# Patient Record
Sex: Female | Born: 1945 | Race: White | Hispanic: No | Marital: Married
Health system: Southern US, Community
[De-identification: ages and names within clinical notes are randomized; demographics above are authoritative.]

## PROBLEM LIST (undated history)

## (undated) DIAGNOSIS — I10 Essential (primary) hypertension: Secondary | ICD-10-CM

## (undated) DIAGNOSIS — E119 Type 2 diabetes mellitus without complications: Secondary | ICD-10-CM

## (undated) DIAGNOSIS — F419 Anxiety disorder, unspecified: Secondary | ICD-10-CM

## (undated) DIAGNOSIS — F331 Major depressive disorder, recurrent, moderate: Principal | ICD-10-CM

---

## 2016-04-09 NOTE — Progress Notes (Signed)
Formatting of this note is different from the original.  INTRAHEPATIC CHOLANGIOCARCINOMA INITIAL VISIT- SURGERY NOTE     HPI   The patient is a 71 year old female who presented in December with hematuria.  Imaging showed a bladder mass as well as findings of cirrhosis with some indeterminate liver lesions.  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  Recommendation was for 6 doses of weekly BCG.  Subsequent further evaluation included an MRI which showed a 2.2 cm lesion in the right hepatic lobe (segment 5) and small lesion in segment 8 (0.8cm).  Biopsy showed adenocarcinoma.  Clinically the patient is doing very well.  No constitutional symptoms abdominal pains nausea vomiting diarrhea constipation melena or hematochezia.    Diagnosis:    Date of 1st Diagnosis: Jan 2018    By: MRI     Labs:    Platelet Count (k/uL)   Date Value   02/06/2016 248   ----------   No results found for: CA199   No results found for: BILIT    PT INR (no units)   Date Value   03/19/2016 1.1   ----------     Imaging:    Details Date   Modality MRI   Number of lesions 2   Site of Lesion Right lobe    Size of largest lesion 2.2   Vascular Invasion No   Lymph node involvement No   Cirrhosis Yes   Extrahepatic spread No     Previous Therapies:  No     Resectable: Yes     Pathology Report:      Biopsy: Yes 03/19/16   Details: Liver biopsy Foci of typical glandular proliferation consistent with   carcinoma in fragments of hepatic parenchyma with bile duct proliferation   and dense chronic inflammatory infiltrate.     Plan:  - Repeat labs including tumour markers  - EGD and colonoscopy  - Tumour board - MRI showed 2 lesions - needs tumour maping in the tumour board  - for liver resection - open    Sampson Goon, MD        SIGNATURE: Theodore Demark, MD PATIENT NAME: Amy Arellano   DATE: April 09, 2016 MRN: [id number]   TIME: 12:48 PM PAGER/CONTACT #:      STAFF NOTE    Pt seen and evaluated by me today. I  agree with the documentation above. Medical records reviewed, data confirmed.   Note adjusted to reflect my direct input.    Theodore Demark, MD  April 09, 2016      Electronically signed by Theodore Demark at 04/09/2016  3:03 PM EDT

## 2016-07-30 ENCOUNTER — Other Ambulatory Visit (HOSPITAL_COMMUNITY): Payer: Self-pay | Admitting: Internal Medicine

## 2016-07-30 DIAGNOSIS — C221 Intrahepatic bile duct carcinoma: Secondary | ICD-10-CM

## 2016-08-04 ENCOUNTER — Ambulatory Visit (HOSPITAL_COMMUNITY)
Admission: RE | Admit: 2016-08-04 | Discharge: 2016-08-04 | Disposition: A | Discharge status: Home / Self Care | Payer: Medicare Other | Source: Ambulatory Visit | Attending: Internal Medicine | Admitting: Internal Medicine

## 2016-08-04 DIAGNOSIS — C221 Intrahepatic bile duct carcinoma: Secondary | ICD-10-CM

## 2016-08-04 DIAGNOSIS — R188 Other ascites: Secondary | ICD-10-CM | POA: Insufficient documentation

## 2016-08-04 NOTE — Procedures (Signed)
Ultrasound-guided  therapeutic paracentesis performed yielding 1.8  liters of yellow fluid. No immediate complications.

## 2019-01-29 IMAGING — US US PARACENTESIS
1 series · 6 of 6 positions shown · non-contrast
Comparison: none

INDICATION: Patient with history of cholangiocarcinoma, ascites. Request made
for therapeutic paracentesis.

[Series 1: us paracentesis · 0.26mm/px · 6 of 6 slices shown]
[im 1/6]
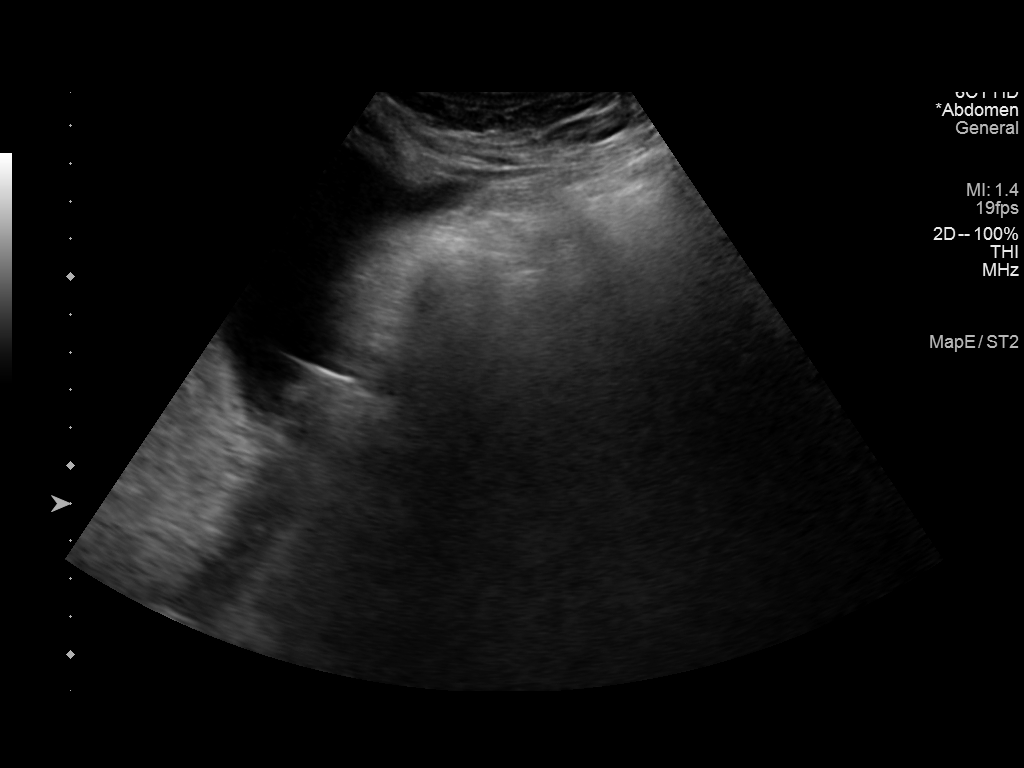
[im 2/6]
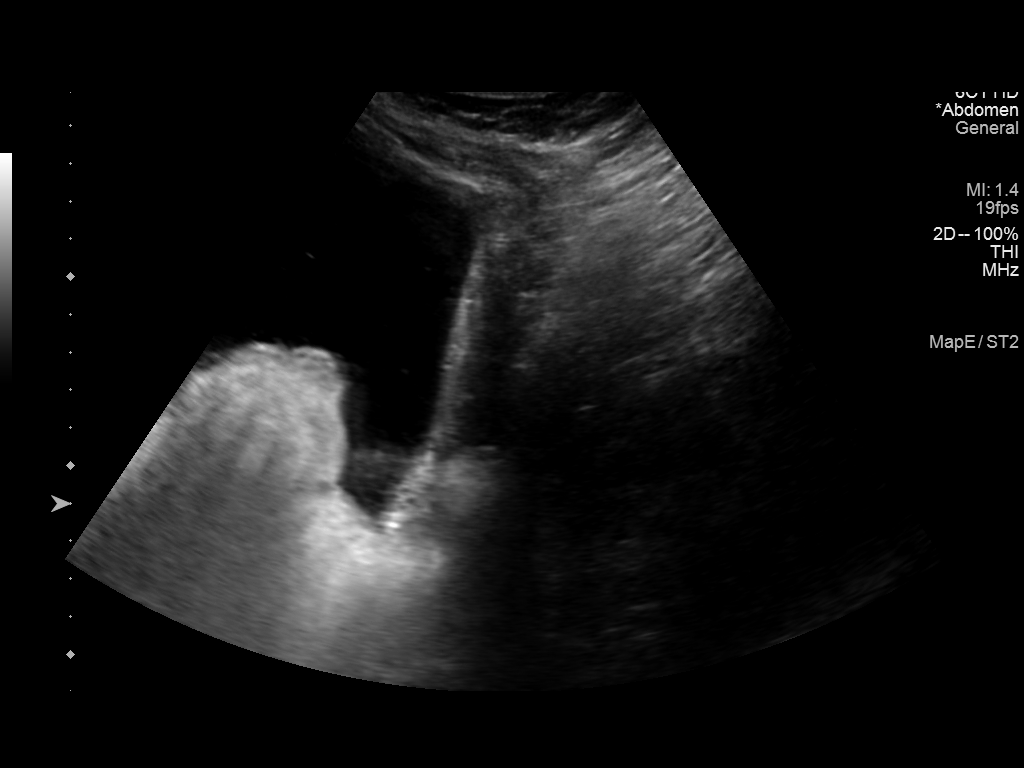
[im 3/6]
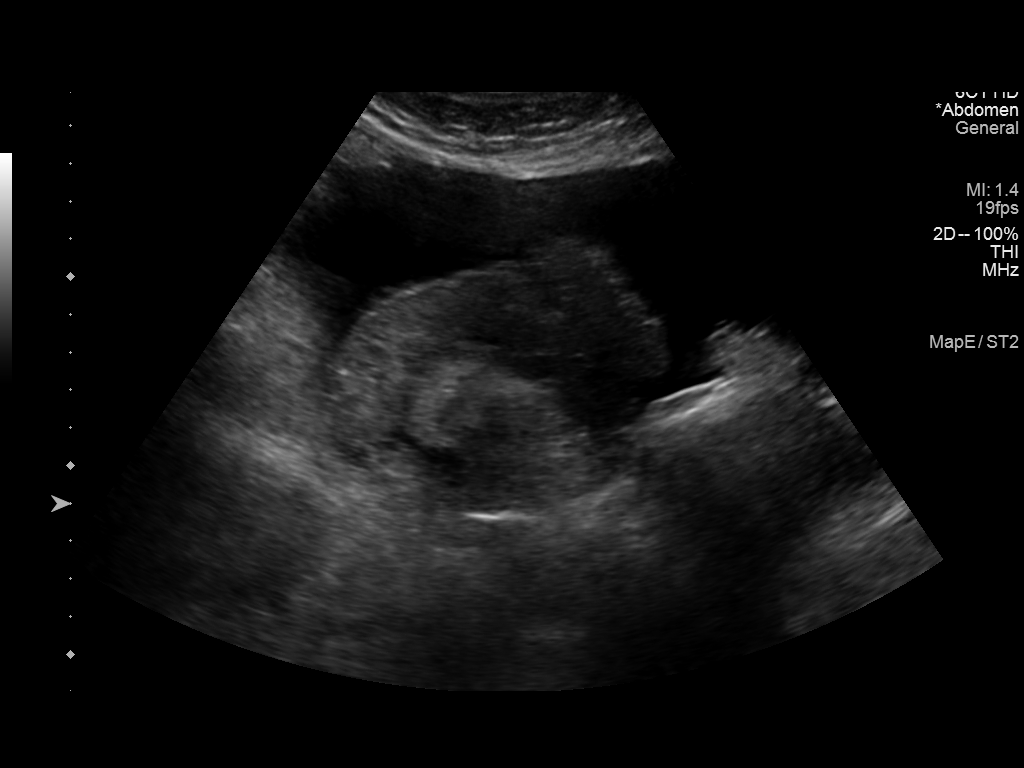
[im 4/6]
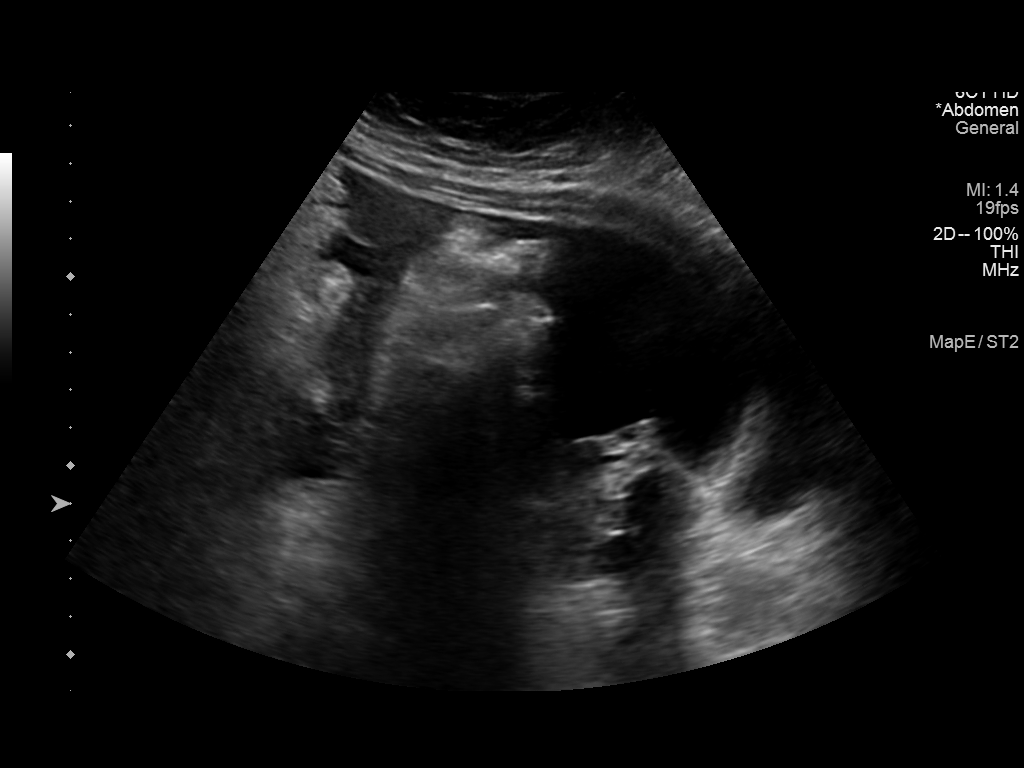
[im 5/6]
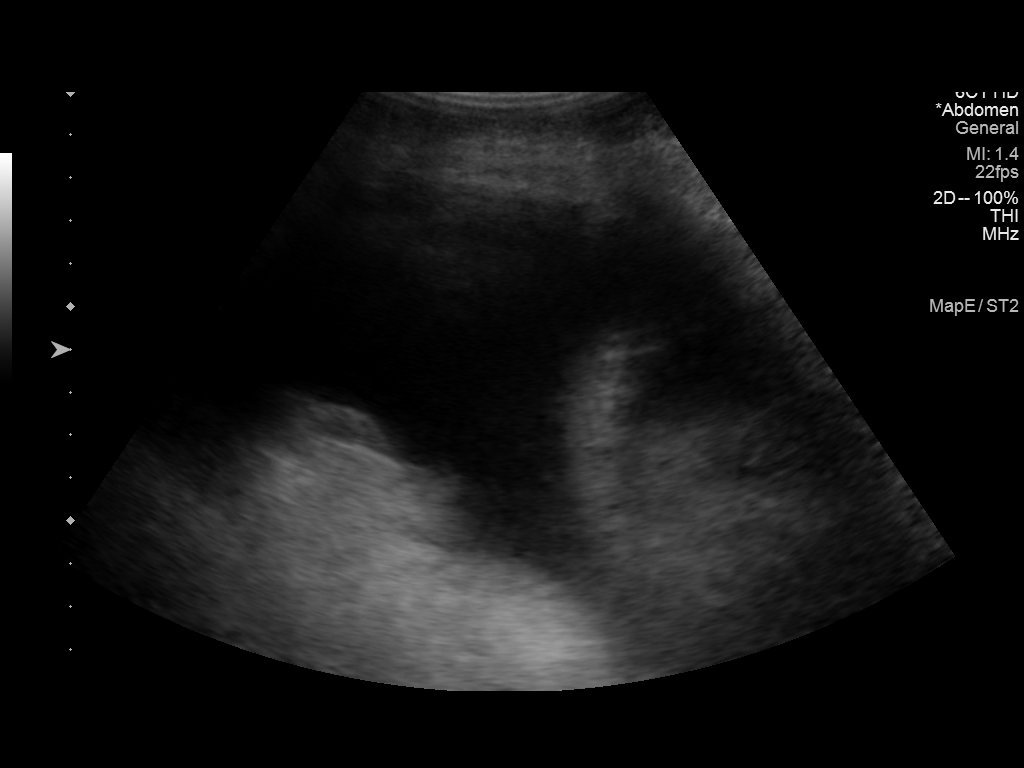
[im 6/6]
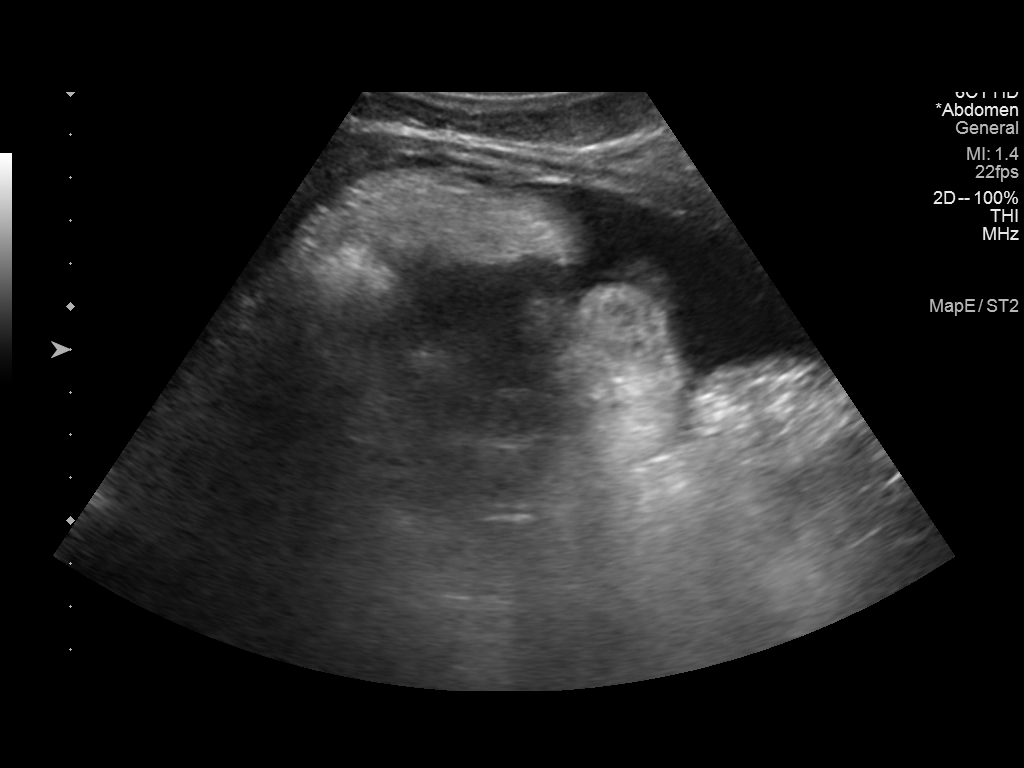

[6 of 6 positions shown; findings below may reference images not displayed]

EXAM:
ULTRASOUND GUIDED THERAPEUTIC PARACENTESIS

MEDICATIONS:
None.

COMPLICATIONS:
None immediate.

PROCEDURE:
Informed written consent was obtained from the patient after a
discussion of the risks, benefits and alternatives to treatment. A
timeout was performed prior to the initiation of the procedure.

Initial ultrasound scanning demonstrates a small to moderate amount
of ascites within the right mid to lower abdominal quadrant. The
right mid to lower abdomen was prepped and draped in the usual
sterile fashion. 1% lidocaine was used for local anesthesia.

Following this, a Yueh catheter was introduced. An ultrasound image
was saved for documentation purposes. The paracentesis was
performed. The catheter was removed and a dressing was applied. The
patient tolerated the procedure well without immediate post
procedural complication.
FINDINGS: A total of approximately 1.8 liters of yellow fluid was removed.
IMPRESSION: Successful ultrasound-guided therapeutic paracentesis yielding
liters of peritoneal fluid.

## 2019-10-21 ENCOUNTER — Inpatient Hospital Stay
Admit: 2019-10-21 | Discharge: 2019-10-21 | Disposition: A | Discharge status: Home / Self Care | Payer: MEDICARE | Attending: Emergency Medicine

## 2019-10-21 ENCOUNTER — Emergency Department: Admit: 2019-10-21 | Payer: MEDICARE | Primary: Internal Medicine

## 2019-10-21 DIAGNOSIS — E871 Hypo-osmolality and hyponatremia: Secondary | ICD-10-CM

## 2019-10-21 LAB — COMPREHENSIVE METABOLIC PANEL
ALT: 32 U/L (ref 5–33)
AST: 39 U/L — ABNORMAL HIGH (ref <32)
Albumin/Globulin Ratio: 1.2 (ref 1.0–2.5)
Albumin: 4.3 g/dL (ref 3.5–5.2)
Alkaline Phosphatase: 104 U/L (ref 35–104)
Anion Gap: 16 mmol/L (ref 9–17)
BUN: 23 mg/dL (ref 8–23)
CO2: 21 mmol/L (ref 20–31)
Calcium: 9.9 mg/dL (ref 8.6–10.4)
Chloride: 89 mmol/L — ABNORMAL LOW (ref 98–107)
Creatinine: 0.82 mg/dL (ref 0.50–0.90)
GFR African American: >60 mL/min (ref >60)
GFR Non-African American: >60 mL/min (ref >60)
Glucose: 157 mg/dL — ABNORMAL HIGH (ref 70–99)
Potassium: 4.3 mmol/L (ref 3.7–5.3)
Sodium: 126 mmol/L — ABNORMAL LOW (ref 135–144)
Total Bilirubin: 0.71 mg/dL (ref 0.3–1.2)
Total Protein: 7.8 g/dL (ref 6.4–8.3)

## 2019-10-21 LAB — BASIC METABOLIC PANEL
Anion Gap: 14 mmol/L (ref 9–17)
BUN: 21 mg/dL (ref 8–23)
CO2: 22 mmol/L (ref 20–31)
Calcium: 9.5 mg/dL (ref 8.6–10.4)
Chloride: 94 mmol/L — ABNORMAL LOW (ref 98–107)
Creatinine: 0.84 mg/dL (ref 0.50–0.90)
GFR African American: >60 mL/min (ref >60)
GFR Non-African American: >60 mL/min (ref >60)
Glucose: 104 mg/dL — ABNORMAL HIGH (ref 70–99)
Potassium: 4 mmol/L (ref 3.7–5.3)
Sodium: 130 mmol/L — ABNORMAL LOW (ref 135–144)

## 2019-10-21 LAB — CBC WITH AUTO DIFFERENTIAL
Absolute Eos #: 0 10*3/uL (ref 0.0–0.4)
Absolute Lymph #: 0.2 10*3/uL — ABNORMAL LOW (ref 1.0–4.8)
Absolute Mono #: 0.29 10*3/uL (ref 0.1–0.8)
Basophils Absolute: 0.1 10*3/uL (ref 0.0–0.2)
Basophils: 1 % (ref 0–2)
Eosinophils %: 0 % — ABNORMAL LOW (ref 1–4)
Hematocrit: 38.4 % (ref 36–46)
Hemoglobin: 12.6 g/dL (ref 12.0–16.0)
Lymphocytes: 2 % — ABNORMAL LOW (ref 24–44)
MCH: 29 pg (ref 26–34)
MCHC: 32.8 g/dL (ref 31–37)
MCV: 88.5 fL (ref 80–100)
MPV: 11.3 fL (ref 8.0–14.0)
Monocytes: 3 % (ref 1–7)
Morphology: NORMAL
Platelets: 179 10*3/uL (ref 140–450)
RBC: 4.34 m/uL (ref 4.0–5.2)
RDW: 15.5 % — ABNORMAL HIGH (ref 12.5–15.4)
Seg Neutrophils: 94 % — ABNORMAL HIGH (ref 36–66)
Segs Absolute: 9.21 10*3/uL — ABNORMAL HIGH (ref 1.8–7.7)
WBC: 9.8 10*3/uL (ref 3.5–11.0)

## 2019-10-21 LAB — URINALYSIS WITH REFLEX TO CULTURE
Bilirubin Urine: NEGATIVE
Glucose, Ur: NEGATIVE
Ketones, Urine: NEGATIVE
Leukocyte Esterase, Urine: NEGATIVE
Nitrite, Urine: NEGATIVE
Protein, UA: NEGATIVE
Specific Gravity, UA: 1.007 (ref 1.005–1.030)
Urine Hgb: NEGATIVE
Urobilinogen, Urine: NORMAL
pH, UA: 5.5 (ref 5.0–8.0)

## 2019-10-21 LAB — LACTIC ACID
Lactic Acid: 2.6 mmol/L — ABNORMAL HIGH (ref 0.5–2.2)
Lactic Acid: 1.9 mmol/L (ref 0.5–2.2)

## 2019-10-21 MED ORDER — POLYETHYLENE GLYCOL 3350 17 G PO PACK
17 g | Freq: Every day | ORAL | 0 refills | Status: AC | PRN
Start: 2019-10-21 — End: 2019-10-28

## 2019-10-21 MED ORDER — SODIUM CHLORIDE 0.9 % IV BOLUS
0.9 % | Freq: Once | INTRAVENOUS | Status: AC
Start: 2019-10-21 — End: 2019-10-21
  Administered 2019-10-21: 21:00:00 1000 mL via INTRAVENOUS

## 2019-10-21 NOTE — ED Notes (Signed)
Delbert Phenix, DO at bedside.     Winifred Olive, RN  10/21/19 1732

## 2019-10-21 NOTE — Discharge Instructions (Signed)
Please make an appointment to follow up with your primary doctor and/or the specialist as we discussed. Take all medications as prescribed.  Return to ER if condition worsens or you develop any new/concerning symptoms as we discussed.

## 2019-10-21 NOTE — ED Notes (Signed)
Patient ambulatory to restroom with steady gait.     Winifred Olive, RN  10/21/19 [phone]

## 2019-10-21 NOTE — ED Provider Notes (Signed)
[facility]  [phone] [address] [address]  [facility] [id number]  Phone: [phone]  Fax: [phone]        [facility]  EMERGENCY DEPARTMENT ENCOUNTER      Pt Name: Amy Arellano  MRN: [id number]  Birthdate 05/15/1945  Date of evaluation: 10/21/2019  Provider: Delbert Pheniximothy M Tore Carreker, DO    CHIEF COMPLAINT       Chief Complaint   Patient presents with   ??? Urinary Frequency         HISTORY OF PRESENT ILLNESS   (Location/Symptom, Timing/Onset,Context/Setting, Quality, Duration, Modifying Factors, Severity)  Note limiting factors.   Amy CorrenteKaren Kue is a 74 y.o. female who presents to the emergency department *For the evaluation of possible UTI.  Patient states that she had a cystoscopy done 8 days ago.  She states that over the weekend following the cystoscopy she started having some cramping/pressure/bloating in her lower abdomen she contacted her primary care physician who put her on Macrobid with a presumed UTI.  She has had what she describes as fever/chills but she did not take her temperature at home.  She is had some nausea and decreased appetite but no vomiting.  Mild dysuria noted.  No diarrhea.  No blood in stool.  No other acute complaints at this time.  All of the symptoms of gotten worse over the past couple of days     Nursing Notes were reviewed.    REVIEW OF SYSTEMS    (2-9systems for level 4, 10 or more for level 5)     Review of Systems   Constitutional: Negative for fever.   HENT: Negative for sore throat.    Respiratory: Negative for shortness of breath.    Cardiovascular: Negative for chest pain.   Gastrointestinal: Positive for abdominal pain. Negative for diarrhea and vomiting.   Genitourinary: Positive for dysuria.   Skin: Negative for rash.   Neurological: Negative for weakness.   All other systems reviewed and are negative.      Except asnoted above the remainder of the review of systems was reviewed and negative.       PAST MEDICAL HISTORY     Past Medical History:   Diagnosis  Date   ??? Cancer (HCC)    ??? Diabetes mellitus (HCC)          SURGICAL HISTORY     History reviewed. No pertinent surgical history.      CURRENT MEDICATIONS     Previous Medications    CHOLECALCIFEROL 50 MCG (2000 UT) TABS    Take 2,000 Units by mouth daily    METFORMIN (GLUCOPHAGE) 1000 MG TABLET    Take 1,000 mg by mouth 2 times daily (with meals)       ALLERGIES     Oxycodone    FAMILY HISTORY       Family History   Family history unknown: Yes          SOCIAL HISTORY       Social History     Socioeconomic History   ??? Marital status: Married     Spouse name: None   ??? Number of children: None   ??? Years of education: None   ??? Highest education level: None   Occupational History   ??? None   Tobacco Use   ??? Smoking status: Never Smoker   ??? Smokeless tobacco: Never Used   Vaping Use   ??? Vaping Use: Never used   Substance and Sexual Activity   ???  Alcohol use: Not Currently   ??? Drug use: Never   ??? Sexual activity: None   Other Topics Concern   ??? None   Social History Narrative   ??? None     Social Determinants of Health     Financial Resource Strain:    ??? Difficulty of Paying Living Expenses:    Food Insecurity:    ??? Worried About Programme researcher, broadcasting/film/video in the Last Year:    ??? Barista in the Last Year:    Transportation Needs:    ??? Freight forwarder (Medical):    ??? Lack of Transportation (Non-Medical):    Physical Activity:    ??? Days of Exercise per Week:    ??? Minutes of Exercise per Session:    Stress:    ??? Feeling of Stress :    Social Connections:    ??? Frequency of Communication with Friends and Family:    ??? Frequency of Social Gatherings with Friends and Family:    ??? Attends Religious Services:    ??? Database administrator or Organizations:    ??? Attends Engineer, structural:    ??? Marital Status:    Intimate Programme researcher, broadcasting/film/video Violence:    ??? Fear of Current or Ex-Partner:    ??? Emotionally Abused:    ??? Physically Abused:    ??? Sexually Abused:        SCREENINGS             PHYSICAL EXAM    (up to 7 for level 4, 8 or  more for level 5)     ED Triage Vitals   BP Temp Temp src Pulse Resp SpO2 Height Weight   -- -- -- -- -- -- -- --       Physical Exam  Vitals and nursing note reviewed.   Constitutional:       General: She is not in acute distress.     Appearance: Normal appearance. She is not ill-appearing or toxic-appearing.   HENT:      Head: Normocephalic and atraumatic.      Nose: Nose normal. No congestion.      Mouth/Throat:      Mouth: Mucous membranes are moist.   Eyes:      General:         Right eye: No discharge.         Left eye: No discharge.      Conjunctiva/sclera: Conjunctivae normal.   Cardiovascular:      Rate and Rhythm: Regular rhythm. Tachycardia present.      Pulses: Normal pulses.      Heart sounds: Normal heart sounds. No murmur heard.     Pulmonary:      Effort: Pulmonary effort is normal. No respiratory distress.      Breath sounds: Normal breath sounds. No wheezing.   Abdominal:      General: Abdomen is flat. There is no distension.      Palpations: Abdomen is soft.      Tenderness: There is abdominal tenderness. There is no guarding or rebound.      Comments: Mild suprapubic tenderness noted   Musculoskeletal:         General: No deformity or signs of injury. Normal range of motion.      Cervical back: Normal range of motion.   Skin:     General: Skin is warm and dry.      Capillary Refill: Capillary refill takes less  than 2 seconds.      Findings: No rash.   Neurological:      General: No focal deficit present.      Mental Status: She is alert. Mental status is at baseline.      Motor: No weakness.      Comments: Speaking normally. No facial asymmetry. Moving all 4 extremities. Normal gait.    Psychiatric:         Mood and Affect: Mood normal.         EMERGENCY DEPARTMENT COURSE and DIFFERENTIAL DIAGNOSIS/MDM:   Vitals:    Vitals:    10/21/19 1555   BP: (!) 142/82   Pulse: 105   Resp: 18   Temp: 98.5 ??F (36.9 ??C)   TempSrc: Oral   SpO2: 99%   Weight: 63.5 kg (140 lb)   Height: 5' (1.524 m)       Patient  presents to the emergency department with a complaint described above.  Vital signs are grossly normal, patient nontoxic, physical examination reveals mild suprapubic tenderness.  She is a little bit tachycardic, certainly have some concern that the Macrobid is not covering the possible UTI that she has.  I do think she could have an a sending urinary tract infection, less likely rupture or different complication from her cystoscopy.  I am going to get full metabolic work-up that includes lactic acid I will get a noncontrast CT of the abdomen and pelvis I am going to give some IV fluids and I will reevaluate      DIAGNOSTIC RESULTS     LABS:  Labs Reviewed   CBC WITH AUTO DIFFERENTIAL - Abnormal; Notable for the following components:       Result Value    RDW 15.5 (*)     Seg Neutrophils 94 (*)     Lymphocytes 2 (*)     Eosinophils % 0 (*)     Segs Absolute 9.21 (*)     Absolute Lymph # 0.20 (*)     All other components within normal limits   COMPREHENSIVE METABOLIC PANEL - Abnormal; Notable for the following components:    Glucose 157 (*)     Sodium 126 (*)     Chloride 89 (*)     AST 39 (*)     All other components within normal limits   LACTIC ACID - Abnormal; Notable for the following components:    Lactic Acid 2.6 (*)     All other components within normal limits   BASIC METABOLIC PANEL - Abnormal; Notable for the following components:    Glucose 104 (*)     Sodium 130 (*)     Chloride 94 (*)     All other components within normal limits   URINE RT REFLEX TO CULTURE   LACTIC ACID       All other labs were within normal range or not returned as of this dictation.    RADIOLOGY:  CT ABDOMEN PELVIS WO CONTRAST Additional Contrast? None   Final Result   Mild retained stool.  No bowel obstruction.      Moderate colonic diverticulosis.      Mild to moderate ascites, nonspecific.               ED Course as of Oct 20 1828   Sat Oct 21, 2019   1705 Hyponatremia noted with sodium 126, patient receiving IV fluids.  No  leukocytosis.  Lactic acid mildly elevated at 2.6.  Urine grossly  unremarkable    [TS]   1730 There is mild retained stool without bowel obstruction but no other significant abnormalities on CT scan    [TS]   1731 Patient sodium was mildly low back in June at 134, will repeat lactic acid and sodium level today prior to discharge    [TS]   1735 On reevaluation patient does explain that she actually has had hyponatremia in the past, she was lower than this previously. We will recheck labs after fluid    [TS]   1827 Take acid down to 1.9 and sodium back up to 130.  Patient instructed to follow-up with primary care physician Monday for reevaluation and further treatment.  We will discharge her on some MiraLAX as this could be the cause of her abdominal discomfort.  Have recommended return to ED if condition worsens or she has any new or concerning symptoms in general    At this time the patient is without objective evidence of an acute process requiring hospitalization or inpatient management. They have remained hemodynamically stable and are stable for discharge with outpatient follow-up. Standard anticipatory guidance given to patient upon discharge.  Have given them a specific time frame in which to follow-up and who to follow-up with.  I have also advised them that they should return to the emergency department if they get worse, or not getting better or develop any new or concerning symptoms.  Patient demonstrates understanding.    [TS]      ED Course User Index  [TS] Delbert Phenix, DO         PROCEDURES:  Unless otherwise noted below, none     Procedures    FINAL IMPRESSION      1. Hyponatremia    2. Constipation, unspecified constipation type          DISPOSITION/PLAN   DISPOSITION        PATIENT REFERRED TO:  Adalberto Cole, MD  [address]  [place] [address]  [phone]    In 3 days        DISCHARGE MEDICATIONS:  New Prescriptions    POLYETHYLENE GLYCOL (MIRALAX) 17 G PACKET    Take 17 g by mouth daily  as needed for Constipation          (Please note that portions of this note were completed with a voice recognition program.  Efforts were made to edit the dictations but occasionally words are mis-transcribed.)    Delbert Phenix, DO (electronically signed)  Board Certified Emergency Physician          Delbert Phenix, DO  10/21/19 1830

## 2019-10-23 ENCOUNTER — Emergency Department: Admit: 2019-10-23 | Payer: MEDICARE | Primary: Internal Medicine

## 2019-10-23 ENCOUNTER — Inpatient Hospital Stay
Admission: EM | Admit: 2019-10-23 | Discharge: 2019-10-25 | Disposition: A | Discharge status: Home / Self Care | Payer: Medicare Other | Admitting: Internal Medicine

## 2019-10-23 LAB — COMPREHENSIVE METABOLIC PANEL
ALT: 48 U/L — ABNORMAL HIGH (ref 5–33)
AST: 60 U/L — ABNORMAL HIGH (ref <32)
Albumin: 4 g/dL (ref 3.5–5.2)
Alkaline Phosphatase: 171 U/L — ABNORMAL HIGH (ref 35–104)
Anion Gap: 21 mmol/L — ABNORMAL HIGH (ref 9–17)
BUN: 22 mg/dL (ref 8–23)
CO2: 18 mmol/L — ABNORMAL LOW (ref 20–31)
Calcium: 10.7 mg/dL — ABNORMAL HIGH (ref 8.6–10.4)
Chloride: 96 mmol/L — ABNORMAL LOW (ref 98–107)
Creatinine: 1.41 mg/dL — ABNORMAL HIGH (ref 0.50–0.90)
GFR African American: 44 mL/min — ABNORMAL LOW (ref >60)
GFR Non-African American: 37 mL/min — ABNORMAL LOW (ref >60)
Glucose: 113 mg/dL — ABNORMAL HIGH (ref 70–99)
Potassium: 3.4 mmol/L — ABNORMAL LOW (ref 3.7–5.3)
Sodium: 135 mmol/L (ref 135–144)
Total Bilirubin: 1.06 mg/dL (ref 0.3–1.2)
Total Protein: 7.6 g/dL (ref 6.4–8.3)

## 2019-10-23 LAB — TROPONIN
Troponin, High Sensitivity: 30 ng/L — ABNORMAL HIGH (ref 0–14)
Troponin, High Sensitivity: 25 ng/L — ABNORMAL HIGH (ref 0–14)

## 2019-10-23 LAB — CBC WITH AUTO DIFFERENTIAL
Absolute Bands #: 2.71 10*3/uL — ABNORMAL HIGH (ref 0.0–1.0)
Absolute Eos #: 0 10*3/uL (ref 0.0–0.4)
Absolute Lymph #: 0.16 10*3/uL — ABNORMAL LOW (ref 1.0–4.8)
Absolute Mono #: 0.25 10*3/uL (ref 0.1–1.3)
Bands: 33 % — ABNORMAL HIGH (ref 0–10)
Basophils Absolute: 0 10*3/uL (ref 0.0–0.2)
Basophils: 0 % (ref 0–2)
Eosinophils %: 0 % (ref 0–4)
Hematocrit: 43.5 % (ref 36–46)
Hemoglobin: 14.2 g/dL (ref 12.0–16.0)
Lymphocytes: 2 % — ABNORMAL LOW (ref 24–44)
MCH: 28.5 pg (ref 26–34)
MCHC: 32.7 g/dL (ref 31–37)
MCV: 87.4 fL (ref 80–100)
MPV: 7.8 fL (ref 6.0–12.0)
Metamyelocytes Absolute: 0.25 10*3/uL — ABNORMAL HIGH
Metamyelocytes: 3 % — ABNORMAL HIGH
Monocytes: 3 % (ref 1–7)
Platelets: 184 10*3/uL (ref 150–450)
RBC: 4.98 m/uL (ref 4.0–5.2)
RDW: 16.9 % — ABNORMAL HIGH (ref 11.5–14.9)
Seg Neutrophils: 59 % (ref 36–66)
Segs Absolute: 4.83 10*3/uL (ref 1.3–9.1)
WBC: 8.2 10*3/uL (ref 3.5–11.0)

## 2019-10-23 LAB — MICROSCOPIC URINALYSIS
Epithelial Cells UA: 20 /HPF
RBC, UA: 2 /HPF
WBC, UA: 10 /HPF

## 2019-10-23 LAB — URINALYSIS WITH REFLEX TO CULTURE
Glucose, Ur: NEGATIVE
Nitrite, Urine: NEGATIVE
Specific Gravity, UA: 1.014 (ref 1.000–1.030)
Urine Hgb: NEGATIVE
Urobilinogen, Urine: NORMAL
pH, UA: 5 (ref 5.0–8.0)

## 2019-10-23 LAB — MAGNESIUM: Magnesium: 1 mg/dL — ABNORMAL LOW (ref 1.6–2.6)

## 2019-10-23 LAB — CORTISOL TOTAL: Cortisol: 87 ug/dL — ABNORMAL HIGH (ref 2.7–18.4)

## 2019-10-23 LAB — LACTATE, SEPSIS
Lactic Acid, Sepsis: 3.4 mmol/L — ABNORMAL HIGH (ref 0.5–1.9)
Lactic Acid, Sepsis: 2.9 mmol/L — ABNORMAL HIGH (ref 0.5–1.9)

## 2019-10-23 MED ORDER — MIDODRINE HCL 5 MG PO TABS
5 MG | Freq: Once | ORAL | Status: AC
Start: 2019-10-23 — End: 2019-10-23
  Administered 2019-10-23: 18:00:00 5 mg via ORAL

## 2019-10-23 MED ORDER — DEXTROSE 5 % IV SOLN
5 % | Freq: Once | INTRAVENOUS | Status: AC
Start: 2019-10-23 — End: 2019-10-23
  Administered 2019-10-23: 15:00:00 2000 mg via INTRAVENOUS

## 2019-10-23 MED ORDER — SODIUM CHLORIDE 0.9 % IV BOLUS
0.9 % | Freq: Once | INTRAVENOUS | Status: AC
Start: 2019-10-23 — End: 2019-10-23
  Administered 2019-10-23: 13:00:00 1000 mL via INTRAVENOUS

## 2019-10-23 MED ORDER — INSULIN LISPRO 100 UNIT/ML SC SOLN
100 UNIT/ML | Freq: Three times a day (TID) | SUBCUTANEOUS | Status: DC
Start: 2019-10-23 — End: 2019-10-25
  Administered 2019-10-24 – 2019-10-25 (×2): 2 [IU] via SUBCUTANEOUS

## 2019-10-23 MED ORDER — ACETAMINOPHEN 325 MG PO TABS
325 MG | Freq: Four times a day (QID) | ORAL | Status: DC | PRN
Start: 2019-10-23 — End: 2019-10-25

## 2019-10-23 MED ORDER — DULOXETINE HCL 60 MG PO CPEP
60 MG | Freq: Every day | ORAL | Status: DC
Start: 2019-10-23 — End: 2019-10-25
  Administered 2019-10-24 – 2019-10-25 (×2): 60 mg via ORAL

## 2019-10-23 MED ORDER — ENOXAPARIN SODIUM 40 MG/0.4ML SC SOLN
40 | Freq: Every day | SUBCUTANEOUS | Status: DC
Start: 2019-10-23 — End: 2019-10-23

## 2019-10-23 MED ORDER — ENOXAPARIN SODIUM 30 MG/0.3ML SC SOLN
30 MG/0.3ML | Freq: Every day | SUBCUTANEOUS | Status: DC
Start: 2019-10-23 — End: 2019-10-25
  Administered 2019-10-24 – 2019-10-25 (×3): 30 mg via SUBCUTANEOUS

## 2019-10-23 MED ORDER — MIDODRINE HCL 10 MG PO TABS
10 MG | ORAL | Status: DC | PRN
Start: 2019-10-23 — End: 2019-10-25
  Administered 2019-10-24: 06:00:00 10 mg via ORAL

## 2019-10-23 MED ORDER — TETANUS-DIPHTH-ACELL PERTUSSIS 5-2.5-18.5 LF-MCG/0.5 IM SUSP
5-2.5-18.5-0.5 LF-MCG/0.5 | Freq: Once | INTRAMUSCULAR | Status: AC
Start: 2019-10-23 — End: 2019-10-23
  Administered 2019-10-23: 14:00:00 0.5 mL via INTRAMUSCULAR

## 2019-10-23 MED ORDER — POTASSIUM CHLORIDE CRYS ER 20 MEQ PO TBCR
20 | ORAL | Status: DC | PRN
Start: 2019-10-23 — End: 2019-10-23

## 2019-10-23 MED ORDER — ACETAMINOPHEN 650 MG RE SUPP
650 MG | Freq: Four times a day (QID) | RECTAL | Status: DC | PRN
Start: 2019-10-23 — End: 2019-10-25

## 2019-10-23 MED ORDER — ONDANSETRON 4 MG PO TBDP
4 MG | Freq: Three times a day (TID) | ORAL | Status: DC | PRN
Start: 2019-10-23 — End: 2019-10-25

## 2019-10-23 MED ORDER — NORMAL SALINE FLUSH 0.9 % IV SOLN
0.9 | INTRAVENOUS | Status: DC | PRN
Start: 2019-10-23 — End: 2019-10-25

## 2019-10-23 MED ORDER — INSULIN LISPRO 100 UNIT/ML SC SOLN
100 UNIT/ML | Freq: Every evening | SUBCUTANEOUS | Status: DC
Start: 2019-10-23 — End: 2019-10-25
  Administered 2019-10-24: 01:00:00 2 [IU] via SUBCUTANEOUS

## 2019-10-23 MED ORDER — SODIUM CHLORIDE 0.9 % IV BOLUS
0.9 % | Freq: Once | INTRAVENOUS | Status: AC
Start: 2019-10-23 — End: 2019-10-23
  Administered 2019-10-23: 17:00:00 1000 mL via INTRAVENOUS

## 2019-10-23 MED ORDER — CEFTRIAXONE SODIUM 1 G IJ SOLR
1 g | Freq: Once | INTRAMUSCULAR | Status: AC
Start: 2019-10-23 — End: 2019-10-23
  Administered 2019-10-23: 14:00:00 1000 mg via INTRAVENOUS

## 2019-10-23 MED ORDER — POTASSIUM CHLORIDE 10 MEQ/100ML IV SOLN
10 | INTRAVENOUS | Status: DC | PRN
Start: 2019-10-23 — End: 2019-10-23

## 2019-10-23 MED ORDER — ONDANSETRON HCL 4 MG/2ML IJ SOLN
4 MG/2ML | Freq: Four times a day (QID) | INTRAMUSCULAR | Status: DC | PRN
Start: 2019-10-23 — End: 2019-10-25

## 2019-10-23 MED ORDER — MAGNESIUM SULFATE 50 % IJ SOLN
50 % | Freq: Once | INTRAMUSCULAR | Status: AC
Start: 2019-10-23 — End: 2019-10-23
  Administered 2019-10-23: 21:00:00 2000 mg via INTRAVENOUS

## 2019-10-23 MED ORDER — POTASSIUM CHLORIDE CRYS ER 20 MEQ PO TBCR
20 MEQ | Freq: Once | ORAL | Status: AC
Start: 2019-10-23 — End: 2019-10-23
  Administered 2019-10-23: 22:00:00 40 meq via ORAL

## 2019-10-23 MED ORDER — POLYETHYLENE GLYCOL 3350 17 G PO PACK
17 g | Freq: Every day | ORAL | Status: DC | PRN
Start: 2019-10-23 — End: 2019-10-25

## 2019-10-23 MED ORDER — SODIUM CHLORIDE 0.9 % IV SOLN
0.9 | INTRAVENOUS | Status: DC | PRN
Start: 2019-10-23 — End: 2019-10-25

## 2019-10-23 MED ORDER — SPIRONOLACTONE 25 MG PO TABS
25 MG | Freq: Every day | ORAL | Status: DC
Start: 2019-10-23 — End: 2019-10-25
  Administered 2019-10-24 – 2019-10-25 (×2): 25 mg via ORAL

## 2019-10-23 MED ORDER — SODIUM CHLORIDE 0.9 % IV SOLN
0.9 % | INTRAVENOUS | Status: DC
Start: 2019-10-23 — End: 2019-10-25
  Administered 2019-10-23 – 2019-10-25 (×3): via INTRAVENOUS

## 2019-10-23 MED ORDER — FUROSEMIDE 20 MG PO TABS
20 MG | Freq: Every day | ORAL | Status: DC
Start: 2019-10-23 — End: 2019-10-25
  Administered 2019-10-24 – 2019-10-25 (×2): 20 mg via ORAL

## 2019-10-23 MED ORDER — POTASSIUM BICARB-CITRIC ACID 20 MEQ PO TBEF
20 MEQ | ORAL | Status: DC | PRN
Start: 2019-10-23 — End: 2019-10-23

## 2019-10-23 MED ORDER — NORMAL SALINE FLUSH 0.9 % IV SOLN
0.9 | Freq: Two times a day (BID) | INTRAVENOUS | Status: DC
Start: 2019-10-23 — End: 2019-10-25
  Administered 2019-10-24 – 2019-10-25 (×2): 10 mL via INTRAVENOUS

## 2019-10-23 MED FILL — MAGNESIUM SULFATE 50 % IJ SOLN: 50 % | INTRAMUSCULAR | Qty: 4

## 2019-10-23 MED FILL — PEG 3350 17 G PO PACK: 17 g | ORAL | Qty: 1

## 2019-10-23 MED FILL — MIDODRINE HCL 5 MG PO TABS: 5 mg | ORAL | Qty: 1

## 2019-10-23 MED FILL — POTASSIUM CHLORIDE CRYS ER 20 MEQ PO TBCR: 20 meq | ORAL | Qty: 2

## 2019-10-23 MED FILL — CEFTRIAXONE SODIUM 1 G IJ SOLR: 1 g | INTRAMUSCULAR | Qty: 1000

## 2019-10-23 MED FILL — BOOSTRIX 5-2.5-18.5 LF-MCG/0.5 IM SUSP: 5-2.5-18.5 LF-MCG/0.5 | INTRAMUSCULAR | Qty: 0.5

## 2019-10-23 NOTE — ED Notes (Signed)
Pt transported to room 2089 by w/c at 1652. Handoff given to [place] [facility] at 1656.     Amy Arellano Amy Arellano  10/23/19 1701

## 2019-10-23 NOTE — ED Notes (Signed)
Code S called at 0855.     [employer]  10/23/19 [phone]

## 2019-10-23 NOTE — ED Notes (Signed)
Pt up to restroom with no assistance, pt denies dizziness.      Amy Arellano  10/23/19 1255

## 2019-10-23 NOTE — ED Notes (Signed)
Bed: 08  Expected date:   Expected time:   Means of arrival:   Comments:  Sonny Masters, RN  10/23/19 [phone]

## 2019-10-23 NOTE — ED Triage Notes (Signed)
Mode of arrival (squad #, walk in, police, etc) : [facility]        Chief complaint(s): Hypotension        Arrival Note (brief scenario, treatment PTA, etc).: Pt arrives to ED c/o hypotension. Patients daughter initially called EMS for potential stroke. Patient states that she woke up feeling sweaty and states that her color was "not good." Patient states that she was recently seen at [facility] ED for dehydration and low sodium. Patient arrives to ED diaphoretic but with no complaints. Patient states that she did fall some time in the middle of the night and did hit her head. Patient noted to have some bleeding to the back of her head but it is controlled. Patient denies any pain. Patient is placed on cardiac monitor and continuous pulse oximetry at this time.         C= "Have you ever felt that you should Cut down on your drinking?"  No  A= "Have people Annoyed you by criticizing your drinking?"  No  G= "Have you ever felt bad or Guilty about your drinking?"  No  E= "Have you ever had a drink as an Eye-opener first thing in the morning to steady your nerves or to help a hangover?"  No      Deferred []       Reason for deferring: N/A    *If yes to two or more: probable alcohol abuse.*

## 2019-10-23 NOTE — H&P (Addendum)
[facility]  IN-PATIENT SERVICE  [facility]   [facility]      [facility]  IN-PATIENT SERVICE   [facility]     HISTORY AND PHYSICAL EXAMINATION            Date:   10/24/2019  Patient name:  Amy Arellano  Date of admission:  10/23/2019  8:37 AM  MRN:   [id number]  Account:  [id number]  Date of Birth:  Dec 12, 1945  PCP:    Adalberto Cole, MD  Room:   2089/2089-01  Code Status:    Full Code    Chief Complaint:     Chief Complaint   Patient presents with   ??? Hypotension       History Obtained From:     patient    History of Present Illness:     The patient is a 74 y.o.  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 States she has had weakness and fatigue for the past 1-2 weeks. Last night patient fell and hit her head - she believes she was going to the bathroom in the middle of the night but is a little confused about the event. CT of head in the ED is unremarkable, EKG is within normal limits. Upon arrival patient was hypotensive, BP 81/55 with tachycardia, HR 127. Magnesium was found to be 1.0 and patient was given replacement. Since then, BP has stabilized at 116/63, HR 91. Patient is denying any nausea, vomiting, diarrhea, chest pain, SOB, palpitations. Patient denies starting any new medication, apart from Cymbalta one month ago. Patient is being admitted for the management of hypomagnesemia with associated hypotension.     Past Medical History:     Past Medical History:   Diagnosis Date   ??? Cancer (HCC)    ??? Diabetes mellitus (HCC)         Past SurgicalHistory:     History reviewed. No pertinent surgical history.     Medications Prior to Admission:        Prior to Admission medications    Medication  Sig Start Date End Date Taking? Authorizing Provider   DULoxetine (CYMBALTA) 60 MG extended release capsule Take 60 mg by mouth daily   Yes Historical Provider, MD   furosemide (LASIX) 20 MG tablet Take 20 mg by mouth daily   Yes Historical Provider, MD   glimepiride (AMARYL) 2 MG tablet Take 2 mg by mouth 2 times daily (before meals)   Yes Historical Provider, MD   spironolactone (ALDACTONE) 50 MG tablet Take 25 mg by mouth daily   Yes Historical Provider, MD   Cholecalciferol 50 MCG (2000 UT) TABS Take 2,000 Units by mouth daily   Yes Historical Provider, MD   metFORMIN (GLUCOPHAGE) 1000 MG tablet Take 1,000 mg by mouth 2 times daily (with meals)   Yes Historical Provider, MD   polyethylene glycol (MIRALAX) 17 g packet Take 17 g by mouth daily as needed for Constipation 10/21/19 10/28/19 Yes Delbert Phenix, DO        Allergies:     Oxycodone    Social History:     Tobacco:    reports that she has never smoked. She has never used smokeless tobacco.  Alcohol:  reports previous alcohol use.  Drug Use:  reports no history of drug use.    Family History:     Family History   Family history unknown: Yes       Review of Systems:     Positive and Negative as described in HPI.    Review of Systems   Constitutional: Positive for fatigue. Negative for chills, diaphoresis and fever.   Eyes: Negative for visual disturbance.   Respiratory: Negative for cough, chest tightness, shortness of breath and wheezing.    Cardiovascular: Negative for chest pain, palpitations and leg swelling.   Gastrointestinal: Negative for abdominal distention, abdominal pain, diarrhea, nausea and vomiting.   Genitourinary: Negative for difficulty urinating.   Musculoskeletal: Negative for arthralgias.   Neurological: Positive for weakness. Negative for dizziness, seizures, speech difficulty, light-headedness and headaches.   Psychiatric/Behavioral: Negative for agitation.       Physical Exam:   BP 125/69    Pulse 100    Temp 98.1 ??F (36.7 ??C)  (Oral)    Resp 18    Ht 4\' 10"  (1.473 m)    Wt 141 lb 8.6 oz (64.2 kg)    SpO2 99%    BMI 29.58 kg/m??   Temp (24hrs), Avg:98.3 ??F (36.8 ??C), Min:97.7 ??F (36.5 ??C), Max:99.3 ??F (37.4 ??C)    Recent Labs     10/23/19  2051 10/24/19  0740 10/24/19  1101   POCGLU 239* 89 193*       Intake/Output Summary (Last 24 hours) at 10/24/2019 1337  Last data filed at 10/24/2019 1216  Gross per 24 hour   Intake 3150 ml   Output 2700 ml   Net 450 ml       Physical Exam  Constitutional:       Appearance: Normal appearance.   HENT:      Head: Normocephalic. Laceration present.   Cardiovascular:      Rate and Rhythm: Normal rate and regular rhythm.      Pulses: Normal pulses.      Heart sounds: Normal heart sounds.   Pulmonary:      Effort: Pulmonary effort is normal.      Breath sounds: Normal breath sounds. No wheezing or rhonchi.   Abdominal:      General: A surgical scar is present. There is distension.      Palpations: Abdomen is soft.      Tenderness: There is no abdominal tenderness. There is no right CVA tenderness or left CVA tenderness.      Hernia: A hernia is present. Hernia is present in the ventral area.      Comments: Large ventral hernia in epigastric region   Skin:     General: Skin is warm.   Neurological:      General: No focal deficit present.      Mental Status: She is alert.   Psychiatric:         Mood and Affect: Mood normal.         Investigations:     Laboratory Testing:  Recent Results (from the past 24 hour(s))   Culture, Urine    Collection Time: 10/23/19  2:52 PM    Specimen: Urine, clean catch   Result Value Ref Range    Specimen Description .CLEAN CATCH URINE     Special Requests NOT REPORTED     Culture NO SIGNIFICANT GROWTH    Urinalysis Reflex to Culture    Collection Time: 10/23/19  3:00 PM  Specimen: Urine, clean catch   Result Value Ref Range    Color, UA Dark Yellow (A) Yellow    Turbidity UA Cloudy (A) Clear    Glucose, Ur NEGATIVE NEGATIVE    Bilirubin Urine SMALL (A) NEGATIVE    Ketones, Urine  TRACE (A) NEGATIVE    Specific Gravity, UA 1.014 1.000 - 1.030    Urine Hgb NEGATIVE NEGATIVE    pH, UA 5.0 5.0 - 8.0    Protein, UA 1+ (A) NEGATIVE    Urobilinogen, Urine Normal Normal    Nitrite, Urine NEGATIVE NEGATIVE    Leukocyte Esterase, Urine MOD (A) NEGATIVE    Urinalysis Comments NOT REPORTED    Microscopic Urinalysis    Collection Time: 10/23/19  3:00 PM   Result Value Ref Range    -          WBC, UA 10 TO 20 /HPF    RBC, UA 2 TO 5 /HPF    Casts UA NOT REPORTED /LPF    Crystals, UA NOT REPORTED None /HPF    Epithelial Cells UA 20 TO 50 /HPF    Renal Epithelial, UA NOT REPORTED 0 /HPF    Bacteria, UA MANY (A) None    Mucus, UA NOT REPORTED None    Trichomonas, UA NOT REPORTED None    Amorphous, UA NOT REPORTED None    Other Observations UA NOT REPORTED NOT REQ.    Yeast, UA NOT REPORTED None   POC Glucose Fingerstick    Collection Time: 10/23/19  8:51 PM   Result Value Ref Range    POC Glucose 239 (H) 65 - 105 mg/dL   Lactic acid, plasma    Collection Time: 10/24/19  6:05 AM   Result Value Ref Range    Lactic Acid 1.1 0.5 - 2.2 mmol/L    Lactic Acid, Whole Blood NOT REPORTED 0.7 - 2.1 mmol/L   Basic Metabolic Panel w/ Reflex to MG    Collection Time: 10/24/19  6:05 AM   Result Value Ref Range    Glucose 96 70 - 99 mg/dL    BUN 31 (H) 8 - 23 mg/dL    CREATININE 0.98 (H) 0.50 - 0.90 mg/dL    Bun/Cre Ratio NOT REPORTED 9 - 20    Calcium 9.1 8.6 - 10.4 mg/dL    Sodium 119 147 - 829 mmol/L    Potassium 4.5 3.7 - 5.3 mmol/L    Chloride 103 98 - 107 mmol/L    CO2 21 20 - 31 mmol/L    Anion Gap 11 9 - 17 mmol/L    GFR Non-African American 39 (L) >60 mL/min    GFR African American 47 (L) >60 mL/min    GFR Comment          GFR Staging NOT REPORTED    CBC auto differential    Collection Time: 10/24/19  6:05 AM   Result Value Ref Range    WBC 6.3 3.5 - 11.0 k/uL    RBC 3.72 (L) 4.0 - 5.2 m/uL    Hemoglobin 10.7 (L) 12.0 - 16.0 g/dL    Hematocrit 56.2 (L) 36 - 46 %    MCV 86.7 80 - 100 fL    MCH 28.8 26 - 34 pg    MCHC  33.3 31 - 37 g/dL    RDW 13.0 (H) 86.5 - 14.9 %    Platelets 168 150 - 450 k/uL    MPV 7.6 6.0 - 12.0 fL    NRBC  Automated NOT REPORTED per 100 WBC    Differential Type NOT REPORTED     Immature Granulocytes NOT REPORTED 0 %    Absolute Immature Granulocyte NOT REPORTED 0.00 - 0.30 k/uL    WBC Morphology NOT REPORTED     RBC Morphology NOT REPORTED     Platelet Estimate NOT REPORTED     Seg Neutrophils 85 (H) 36 - 66 %    Lymphocytes 11 (L) 24 - 44 %    Monocytes 3 1 - 7 %    Eosinophils % 1 0 - 4 %    Basophils 0 0 - 2 %    Segs Absolute 5.30 1.3 - 9.1 k/uL    Absolute Lymph # 0.70 (L) 1.0 - 4.8 k/uL    Absolute Mono # 0.20 0.1 - 1.3 k/uL    Absolute Eos # 0.10 0.0 - 0.4 k/uL    Basophils Absolute 0.00 0.0 - 0.2 k/uL   Magnesium    Collection Time: 10/24/19  6:05 AM   Result Value Ref Range    Magnesium 2.0 1.6 - 2.6 mg/dL   POC Glucose Fingerstick    Collection Time: 10/24/19  7:40 AM   Result Value Ref Range    POC Glucose 89 65 - 105 mg/dL   POC Glucose Fingerstick    Collection Time: 10/24/19 11:01 AM   Result Value Ref Range    POC Glucose 193 (H) 65 - 105 mg/dL       Imaging/Diagnostics:  CT ABDOMEN PELVIS WO CONTRAST Additional Contrast? None    Result Date: 10/21/2019  EXAMINATION: CT OF THE ABDOMEN AND PELVIS WITHOUT CONTRAST 10/21/2019 4:06 pm TECHNIQUE: CT of the abdomen and pelvis was performed without the administration of intravenous contrast. Multiplanar reformatted images are provided for review. Dose modulation, iterative reconstruction, and/or weight based adjustment of the mA/kV was utilized to reduce the radiation dose to as low as reasonably achievable. COMPARISON: None. HISTORY: ORDERING SYSTEM PROVIDED HISTORY: ab pain TECHNOLOGIST PROVIDED HISTORY: ab pain Decision Support Exception - unselect if not a suspected or confirmed emergency medical condition->Emergency Medical Condition (MA) Reason for Exam: Urinary frequency, lower abdominal pain, recent cystoscopy Acuity: Acute Type of Exam:  Initial FINDINGS: Lower Chest: Lung bases are clear.  Mild esophageal wall thickening. Organs: Hypoattenuation liver suggesting fatty infiltration.  Posterior change identified gallbladder fossa.  Otherwise noncontrast of the liver, spleen, adrenal glands, kidneys, pancreas unremarkable. GI/Bowel: Wall thickening of the stomach may relate to underdistention. Otherwise mild retained stool throughout the colon.  No evidence of bowel obstruction.  Colonic diverticulosis.  Appendix unremarkable. Pelvis: Uterus, adnexa, and bladder unremarkable.  Anterior wall nodularity involving the uterus likely representing a focal area of fibroid minimal anterior bladder wall thickening likely reactive.  Small left inguinal hernia identified containing fluid. Peritoneum/Retroperitoneum: Mild-to-moderate free fluid nonspecific.  Perhaps related to underlying ascites.  No free air.  Ventral abdominal hernia containing loops of bowel noted.  Mild haziness identified involving anterior mesenteric fat may related to 3rd spacing. Bones/Soft Tissues: Degenerate change.  No suspicious osseous lesion.     Mild retained stool.  No bowel obstruction. Moderate colonic diverticulosis. Mild to moderate ascites, nonspecific.     CT HEAD WO CONTRAST    Result Date: 10/23/2019  EXAMINATION: CT OF THE HEAD WITHOUT CONTRAST  10/23/2019 9:12 am TECHNIQUE: CT of the head was performed without the administration of intravenous contrast. Dose modulation, iterative reconstruction, and/or weight based adjustment of the mA/kV was utilized to reduce the radiation dose  to as low as reasonably achievable. COMPARISON: None. HISTORY: ORDERING SYSTEM PROVIDED HISTORY: ams head injury TECHNOLOGIST PROVIDED HISTORY: ams head injury Decision Support Exception - unselect if not a suspected or confirmed emergency medical condition->Emergency Medical Condition (MA) Reason for Exam: fell when getting up today, was disoriented Acuity: Unknown Type of Exam: Unknown FINDINGS:  BRAIN/VENTRICLES: There is no acute intracranial hemorrhage, mass effect or midline shift.  No abnormal extra-axial fluid collection.  The gray-white differentiation is maintained without evidence of an acute infarct.  There is no evidence of hydrocephalus. Scattered hypodensity is present in the white matter consistent with chronic microvascular change. ORBITS: The visualized portion of the orbits demonstrate no acute abnormality. SINUSES: The visualized paranasal sinuses and mastoid air cells demonstrate no acute abnormality. SOFT TISSUES/SKULL:  No acute abnormality of the visualized skull or soft tissues.     No acute intracranial abnormality.  Senescent changes including chronic microvascular change are present.     XR CHEST PORTABLE    Result Date: 10/23/2019  EXAMINATION: ONE XRAY VIEW OF THE CHEST 10/23/2019 10:09 am COMPARISON: None. HISTORY: ORDERING SYSTEM PROVIDED HISTORY: hypotension TECHNOLOGIST PROVIDED HISTORY: hypotension Reason for Exam: hypotension Acuity: Acute Type of Exam: Initial FINDINGS: AP portable view of the chest time stamped at 1016 hours demonstrates overlying cardiac monitoring electrodes.  Heart size is normal.  No vascular congestion, focal consolidation, effusion, or pneumothorax is noted.  Osseous and mediastinal structures are age-appropriate.  A small hiatal hernia is noted.     No acute cardiopulmonary process.  Small hiatal hernia.       Assessment :      Primary Problem  Hypomagnesemia    Active Hospital Problems    Diagnosis Date Noted   ??? Hypomagnesemia [E83.42] 10/23/2019   ??? Type 2 diabetes mellitus without complication, without long-term current use of insulin (HCC) [E11.9] 10/23/2019   ??? AKI (acute kidney injury) (HCC) [N17.9] 10/23/2019   ??? Hypokalemia [E87.6] 10/23/2019       Plan:     Patient status Admit as inpatient in the  Progressive Unit/Step down    Hypomagnesemia   - Asymptomatic, BP on admission 81/55  - Elevated troponin, 30, 25 and lactic acid 3.4->2.9 in  ED  - Cortisol 87.0  - Mg 1.0   - Given 2g IV magnesium in ED, another 2g ordered.   - Monitor BMP     Hypokalemia likely 2/2 hypomagnesemia   - K 3.4   - Give PO potassium   - Monitor BMP    AKI likely 2/2 to hypotension   - Creatinine 1.41, baseline 0.8  - BUN 22, GFR 37 (baseline >60)  - Monitor BMP  - IV NS 175ml/hr    Diabetes mellitus, type 2  - Hold home metformin  - Hold home glimepiride  - Start medium dose sliding scale  - Hypoglycemia protocol in place    DVT Prophylaxis - Lovenox      Consultations:   IP CONSULT TO INTERNAL MEDICINE  IP CONSULT TO SOCIAL WORK    Patient is admitted as inpatient status because of co-morbiditieslisted above, severity of signs and symptoms as outlined, requirement for current medical therapies and most importantly because of direct risk to patient if care not provided in a hospital setting.    Helyn App, MD  10/24/2019  1:37 PM    Copy sent to Dr. Adalberto Cole, MD  Attending Physician Statement    I have discussed the case of Burnett Corrente,  including pertinent history and exam findings with the resident. I have seen and examined the patient and the key elements of the encounter have been performed by me. I agree with the assessment, plan, and orders as documented by the resident.  The patient did have history of UTI prior to admission.  He was admitted with confusion ataxia and fall.  It is most likely due to electrolyte imbalance with low magnesium and potassium.  She did have a small elevation of lactic acid which could be secondary to Metformin.  Metformin can cause lactic acidosis in the presence of volume depletion and dehydration.  Her diabetic regimen will be revised  Electronically signed by Helyn App, MD on 10/24/2019 at 1:37 PM

## 2019-10-23 NOTE — ED Notes (Signed)
Pt up to restroom with no assistance, pt is able to ambulate with a steady gait, denies dizziness.      Alyzza Andringa  10/23/19 1446

## 2019-10-23 NOTE — Progress Notes (Signed)
Medication History completed:    New medications: spironolactone, pioglitazone, glimepiride, furosemide, duloxetine, bupropion XL    Medications discontinued: none    Changes to dosing: none    Stated allergies: As listed    Other pertinent information: Medications confirmed with [facility], [facility], and [facility].     Thank you,  Ginger Carne, PharmD, BCPS  [phone]

## 2019-10-23 NOTE — Progress Notes (Signed)
Emergency dr at bedside. 5 staples to laceration on the back of head. No distress noted to patient.

## 2019-10-23 NOTE — ED Provider Notes (Signed)
[facility] PROGRESSIVE CARE  EMERGENCY DEPARTMENT ENCOUNTER      Pt Name: Amy Arellano  MRN: [id number]  Birthdate April 29, 1945  Date of evaluation: 10/23/19    CHIEF COMPLAINT       Chief Complaint   Patient presents with   ??? Hypotension     HISTORY OF PRESENT ILLNESS   HPI 74 y.o. female presents with c/o generalized weakness and confusion.  The patient reports that she has been feeling weak and fatigued for the last 1 to 2 weeks.  A week ago she was put on a course of antiboitics because she thought she might have a UTI.  She was feeling some burning with urination and suprapubic pressure.  Over the weekend, she was seen at an outside facility emergency department and was told she had low sodium and she was dehydrated.  She states that she was staying at her daughter's house last night, she was supposed to be watching her grandkids.  Overnight she got up to use the bathroom, she was very unsteady and she fell and hit the back of her head.  She is not sure what she hit it on, she does not think she passed out.  This morning her daughter found her and she was confused, she thought she might be having a stroke.  EMS was called.  EMS found her to be hypotensive and tachycardic.  They attempted to establish a peripheral IV but they were unsuccessful and they transferred her to the hospital.  Her blood pressure improved in route without medication provided.  Patient denies any complaints at this time other than being fatigued.  No headache, no neck pain, no cough, no vomiting or diarrhea.  She reports that she is on 2 " water pills" but she is not exactly sure the name.  Reviewing care everywhere, it reports that she is on spironolactone 50 mg, Lasix 20 mg, she also is on Amaryl 2 mg Metformin and omeprazole.  Patient has multiple medical conditions including a history of bladder cancer, diabetes, hypertension, and intrahepatic cholangiocarcinoma.    REVIEW OF SYSTEMS       Review of Systems   Constitutional: Positive for  diaphoresis and fatigue. Negative for activity change, appetite change and fever.   HENT: Negative for congestion.    Eyes: Negative for visual disturbance.   Respiratory: Negative for cough and shortness of breath.    Cardiovascular: Negative for chest pain and leg swelling.   Gastrointestinal: Negative for abdominal pain, diarrhea, nausea and vomiting.   Genitourinary: Positive for dysuria (A week ago but none now).   Musculoskeletal: Negative for back pain and neck pain.   Skin: Positive for wound (On the back of her head). Negative for rash.   Neurological: Positive for weakness (Generalized) and light-headedness (When standing). Negative for seizures, speech difficulty and headaches.   Psychiatric/Behavioral: Positive for confusion (Daughter thought she was confused this morning prompting the EMS call).       PAST MEDICAL HISTORY     Past Medical History:   Diagnosis Date   ??? Cancer (HCC)    ??? Diabetes mellitus (HCC)        SURGICAL HISTORY     History reviewed. No pertinent surgical history.    CURRENT MEDICATIONS       Current Discharge Medication List      CONTINUE these medications which have NOT CHANGED    Details   DULoxetine (CYMBALTA) 60 MG extended release capsule Take 60 mg by mouth daily  furosemide (LASIX) 20 MG tablet Take 20 mg by mouth daily      glimepiride (AMARYL) 2 MG tablet Take 2 mg by mouth 2 times daily (before meals)      spironolactone (ALDACTONE) 50 MG tablet Take 25 mg by mouth daily      Cholecalciferol 50 MCG (2000 UT) TABS Take 2,000 Units by mouth daily      metFORMIN (GLUCOPHAGE) 1000 MG tablet Take 1,000 mg by mouth 2 times daily (with meals)      polyethylene glycol (MIRALAX) 17 g packet Take 17 g by mouth daily as needed for Constipation  Qty: 7 each, Refills: 0             ALLERGIES     is allergic to oxycodone.    FAMILY HISTORY     has no family status information on file.        SOCIAL HISTORY      reports that she has never smoked. She has never used smokeless  tobacco. She reports previous alcohol use. She reports that she does not use drugs.    PHYSICAL EXAM     INITIAL VITALS: BP 125/69    Pulse 100    Temp 98.1 ??F (36.7 ??C) (Oral)    Resp 18    Ht 4\' 10"  (1.473 m)    Wt 141 lb 8.6 oz (64.2 kg)    SpO2 99%    BMI 29.58 kg/m??     GEN: NAD  Head: There is a 2 cm laceration to the left occipital area  HEENT: PERRL.  EOMI, No conjunctival hemorrhage.  No pupil deformity.    Negative battle sign, negative hemotympanum, negative csf rhinorrhea. Negative raccoon eyes.    No loose teeth.    Neck: No subq emphysema. Cervical spine with no midline TTP.  Chest: Ribs without TTP.  No bruising, lacerations, or abrasions.   CVS: RRR, no murmurs, no rubs, no muffled heart sounds.  Bilateral radial, dp, and pt pulses are 2+.   Pulm: CTA b/l.  Normal breath sounds over all lung fields.   Abd: soft, non-tender to palpation, there is a large reducible nontender midline abdominal wall hernia, no ecchymosis, or erythema, no guarding or rebound  Skin: There is an abrasion to the left forearm, and there is some bruising over the arm   MSK: No focal bony tenderness to palpation no CVA tenderness to palpation  Neurologic: Patient is alert and oriented x3, motor and sensation is intact in all 4 extremities, speech is fluent  Extremities: The patient's extremities are cool       MEDICAL DECISION MAKING:     MDM  74 y.o. female presenting with head injury hypotension tachycardia.  The patient is not on anticoagulation.  Pain is CT scan of the head.  Will also evaluate for sepsis given the tachycardia and tachypnea.  Obtaining cultures.  Cardiac evaluation.  Giving IV fluids and will reassess.       Emergency Department course:  12:47  Repeat sepsis exam performed.   IVF bolus given.   Lactate 3.4 -> 2.9  Troponin 30->25  Pt still borderline hypotensive, giving midodrine and more fluid.  Mg low, replacing IV.   Still waiting for Urinalysis.   Abx given.     DIAGNOSTIC RESULTS     EKG: All EKG's are  interpreted by the Emergency Department Physician who either signs or Co-signs this chart in the absence of a cardiologist.    EKG shows a  sinus tachycardia HR is 118, PR 148, QRS 116, QTC 468, no STE, No STD,TWI, the axis is normal.      RADIOLOGY:All plain film, CT, MRI, and formal ultrasound images (except ED bedside ultrasound) are read by the radiologist and the images and interpretations are directly viewed by the emergency physician.     XR CHEST PORTABLE   Final Result   No acute cardiopulmonary process.  Small hiatal hernia.         CT HEAD WO CONTRAST   Final Result   No acute intracranial abnormality.  Senescent changes including chronic   microvascular change are present.             LABS: All lab results were reviewed by myself, and all abnormals are listed below.  Labs Reviewed   CBC WITH AUTO DIFFERENTIAL - Abnormal; Notable for the following components:       Result Value    RDW 16.9 (*)     Lymphocytes 2 (*)     Bands 33 (*)     Metamyelocytes 3 (*)     Absolute Lymph # 0.16 (*)     Absolute Bands # 2.71 (*)     Metamyelocytes Absolute 0.25 (*)     All other components within normal limits   COMPREHENSIVE METABOLIC PANEL - Abnormal; Notable for the following components:    Glucose 113 (*)     CREATININE 1.41 (*)     Calcium 10.7 (*)     Potassium 3.4 (*)     Chloride 96 (*)     CO2 18 (*)     Anion Gap 21 (*)     Alkaline Phosphatase 171 (*)     ALT 48 (*)     AST 60 (*)     GFR Non-African American 37 (*)     GFR African American 44 (*)     All other components within normal limits   TROPONIN - Abnormal; Notable for the following components:    Troponin, High Sensitivity 30 (*)     All other components within normal limits   TROPONIN - Abnormal; Notable for the following components:    Troponin, High Sensitivity 25 (*)     All other components within normal limits   MAGNESIUM - Abnormal; Notable for the following components:    Magnesium 1.0 (*)     All other components within normal limits    CORTISOL TOTAL - Abnormal; Notable for the following components:    Cortisol 87.0 (*)     All other components within normal limits   URINE RT REFLEX TO CULTURE - Abnormal; Notable for the following components:    Color, UA Dark Yellow (*)     Turbidity UA Cloudy (*)     Bilirubin Urine SMALL (*)     Ketones, Urine TRACE (*)     Protein, UA 1+ (*)     Leukocyte Esterase, Urine MOD (*)     All other components within normal limits   LACTATE, SEPSIS - Abnormal; Notable for the following components:    Lactic Acid, Sepsis 3.4 (*)     All other components within normal limits   LACTATE, SEPSIS - Abnormal; Notable for the following components:    Lactic Acid, Sepsis 2.9 (*)     All other components within normal limits   MICROSCOPIC URINALYSIS - Abnormal; Notable for the following components:    Bacteria, UA MANY (*)     All other components within normal  limits   BASIC METABOLIC PANEL W/ REFLEX TO MG FOR LOW K - Abnormal; Notable for the following components:    BUN 31 (*)     CREATININE 1.35 (*)     GFR Non-African American 39 (*)     GFR African American 47 (*)     All other components within normal limits   CBC WITH AUTO DIFFERENTIAL - Abnormal; Notable for the following components:    RBC 3.72 (*)     Hemoglobin 10.7 (*)     Hematocrit 32.2 (*)     RDW 17.5 (*)     Seg Neutrophils 85 (*)     Lymphocytes 11 (*)     Absolute Lymph # 0.70 (*)     All other components within normal limits   POC GLUCOSE FINGERSTICK - Abnormal; Notable for the following components:    POC Glucose 239 (*)     All other components within normal limits   POC GLUCOSE FINGERSTICK - Abnormal; Notable for the following components:    POC Glucose 193 (*)     All other components within normal limits   POC GLUCOSE FINGERSTICK - Abnormal; Notable for the following components:    POC Glucose 115 (*)     All other components within normal limits   CULTURE, BLOOD 1   CULTURE, URINE   CULTURE, BLOOD 1   LACTIC ACID, PLASMA   MAGNESIUM   MAGNESIUM    POC GLUCOSE FINGERSTICK       EMERGENCY DEPARTMENT COURSE:   Vitals:    Vitals:    10/24/19 0040 10/24/19 0500 10/24/19 0815 10/24/19 1315   BP: (!) 92/55  109/61 125/69   Pulse: 96  90 100   Resp: 16  17 18    Temp: 99.3 ??F (37.4 ??C)  98.1 ??F (36.7 ??C) 98.1 ??F (36.7 ??C)   TempSrc: Oral  Axillary Oral   SpO2: 97%  98% 99%   Weight:  141 lb 8.6 oz (64.2 kg)     Height:  4\' 10"  (1.473 m)         The patient was given the following medications while in the emergency department:  Orders Placed This Encounter   Medications   ??? 0.9 % sodium chloride bolus   ??? magnesium sulfate 2,000 mg in dextrose 5 % 100 mL IVPB   ??? Tetanus-Diphth-Acell Pertussis (BOOSTRIX) injection 0.5 mL   ??? cefTRIAXone (ROCEPHIN) 1000 mg IVPB in 50 mL D5W minibag     Order Specific Question:   Antimicrobial Indications     Answer:   Sepsis of Unknown Etiology   ??? 0.9 % sodium chloride bolus   ??? midodrine (PROAMATINE) tablet 5 mg   ??? sodium chloride flush 0.9 % injection 5-40 mL   ??? sodium chloride flush 0.9 % injection 5-40 mL   ??? 0.9 % sodium chloride infusion   ??? DISCONTD: enoxaparin (LOVENOX) injection 40 mg   ??? OR Linked Order Group    ??? ondansetron (ZOFRAN-ODT) disintegrating tablet 4 mg    ??? ondansetron (ZOFRAN) injection 4 mg   ??? polyethylene glycol (GLYCOLAX) packet 17 g   ??? OR Linked Order Group    ??? acetaminophen (TYLENOL) tablet 650 mg    ??? acetaminophen (TYLENOL) suppository 650 mg   ??? DISCONTD: potassium chloride (KLOR-CON M) extended release tablet 40 mEq   ??? DISCONTD: potassium bicarb-citric acid (EFFER-K) effervescent tablet 40 mEq   ??? DISCONTD: potassium chloride 10 mEq/100 mL IVPB (Peripheral Line)   ???  magnesium sulfate 2,000 mg in dextrose 5 % 100 mL IVPB   ??? insulin lispro (HUMALOG) injection vial 0-12 Units   ??? insulin lispro (HUMALOG) injection vial 0-6 Units   ??? enoxaparin (LOVENOX) injection 30 mg   ??? DULoxetine (CYMBALTA) extended release capsule 60 mg   ??? furosemide (LASIX) tablet 20 mg   ??? spironolactone (ALDACTONE) tablet  25 mg   ??? 0.9 % sodium chloride infusion   ??? potassium chloride (KLOR-CON M) extended release tablet 40 mEq   ??? midodrine (PROAMATINE) tablet 10 mg   ??? cefTRIAXone (ROCEPHIN) 1000 mg IVPB in 50 mL D5W minibag     Order Specific Question:   Antimicrobial Indications     Answer:   Urinary Tract Infection     -------------------------  CRITICAL CARE:   CONSULTS: IP CONSULT TO INTERNAL MEDICINE  IP CONSULT TO SOCIAL WORK  PROCEDURES: Lac Repair    Date/Time: 10/24/2019 5:19 PM  Performed by: Manley Mason, MD  Authorized by: Bjorn Loser, MD     Consent:     Consent obtained:  Verbal    Consent given by:  Patient    Risks discussed:  Infection and pain    Alternatives discussed:  No treatment  Anesthesia (see MAR for exact dosages):     Anesthesia method:  Local infiltration    Local anesthetic:  Lidocaine 1% w/o epi  Laceration details:     Location:  Scalp    Scalp location:  Occipital    Length (cm):  5    Depth (mm):  5  Repair type:     Repair type:  Simple  Pre-procedure details:     Preparation:  Patient was prepped and draped in usual sterile fashion and imaging obtained to evaluate for foreign bodies  Exploration:     Hemostasis achieved with:  Direct pressure    Wound exploration: wound explored through full range of motion      Contaminated: no    Treatment:     Area cleansed with:  Betadine and saline    Amount of cleaning:  Standard    Irrigation solution:  Sterile saline    Irrigation volume:  1000    Irrigation method:  Pressure wash    Visualized foreign bodies/material removed: no    Skin repair:     Repair method:  Staples    Number of staples:  5  Approximation:     Approximation:  Close  Post-procedure details:     Dressing:  Open (no dressing)    Patient tolerance of procedure:  Tolerated well, no immediate complications         FINAL IMPRESSION      1. Acute cystitis without hematuria    2. Hypomagnesemia    3. Dehydration    4. Laceration of scalp, initial encounter           DISPOSITION/PLAN   DISPOSITION        PATIENT REFERRED TO:  No follow-up provider specified.    DISCHARGE MEDICATIONS:  Current Discharge Medication List            Manley Mason, MD  Attending Emergency Physician                      Manley Mason, MD  10/24/19 [phone]

## 2019-10-24 LAB — POC GLUCOSE FINGERSTICK
POC Glucose: 239 mg/dL — ABNORMAL HIGH (ref 65–105)
POC Glucose: 89 mg/dL (ref 65–105)
POC Glucose: 193 mg/dL — ABNORMAL HIGH (ref 65–105)
POC Glucose: 115 mg/dL — ABNORMAL HIGH (ref 65–105)

## 2019-10-24 LAB — BASIC METABOLIC PANEL W/ REFLEX TO MG FOR LOW K
Anion Gap: 11 mmol/L (ref 9–17)
BUN: 31 mg/dL — ABNORMAL HIGH (ref 8–23)
CO2: 21 mmol/L (ref 20–31)
Calcium: 9.1 mg/dL (ref 8.6–10.4)
Chloride: 103 mmol/L (ref 98–107)
Creatinine: 1.35 mg/dL — ABNORMAL HIGH (ref 0.50–0.90)
GFR African American: 47 mL/min — ABNORMAL LOW (ref >60)
GFR Non-African American: 39 mL/min — ABNORMAL LOW (ref >60)
Glucose: 96 mg/dL (ref 70–99)
Potassium: 4.5 mmol/L (ref 3.7–5.3)
Sodium: 135 mmol/L (ref 135–144)

## 2019-10-24 LAB — MAGNESIUM: Magnesium: 2 mg/dL (ref 1.6–2.6)

## 2019-10-24 LAB — EKG 12-LEAD
Atrial Rate: 118 {beats}/min
P Axis: 69 degrees
P-R Interval: 148 ms
Q-T Interval: 334 ms
QRS Duration: 116 ms
QTc Calculation (Bazett): 468 ms
R Axis: 19 degrees
T Axis: 40 degrees
Ventricular Rate: 118 {beats}/min

## 2019-10-24 LAB — CBC WITH AUTO DIFFERENTIAL
Absolute Eos #: 0.1 10*3/uL (ref 0.0–0.4)
Absolute Lymph #: 0.7 10*3/uL — ABNORMAL LOW (ref 1.0–4.8)
Absolute Mono #: 0.2 10*3/uL (ref 0.1–1.3)
Basophils Absolute: 0 10*3/uL (ref 0.0–0.2)
Basophils: 0 % (ref 0–2)
Eosinophils %: 1 % (ref 0–4)
Hematocrit: 32.2 % — ABNORMAL LOW (ref 36–46)
Hemoglobin: 10.7 g/dL — ABNORMAL LOW (ref 12.0–16.0)
Lymphocytes: 11 % — ABNORMAL LOW (ref 24–44)
MCH: 28.8 pg (ref 26–34)
MCHC: 33.3 g/dL (ref 31–37)
MCV: 86.7 fL (ref 80–100)
MPV: 7.6 fL (ref 6.0–12.0)
Monocytes: 3 % (ref 1–7)
Platelets: 168 10*3/uL (ref 150–450)
RBC: 3.72 m/uL — ABNORMAL LOW (ref 4.0–5.2)
RDW: 17.5 % — ABNORMAL HIGH (ref 11.5–14.9)
Seg Neutrophils: 85 % — ABNORMAL HIGH (ref 36–66)
Segs Absolute: 5.3 10*3/uL (ref 1.3–9.1)
WBC: 6.3 10*3/uL (ref 3.5–11.0)

## 2019-10-24 LAB — CULTURE, URINE: Culture: NO GROWTH

## 2019-10-24 LAB — LACTIC ACID: Lactic Acid: 1.1 mmol/L (ref 0.5–2.2)

## 2019-10-24 MED ORDER — DIPHENHYDRAMINE HCL 25 MG PO TABS
25 MG | Freq: Four times a day (QID) | ORAL | Status: DC | PRN
Start: 2019-10-24 — End: 2019-10-25
  Administered 2019-10-25: 02:00:00 25 mg via ORAL

## 2019-10-24 MED ORDER — CEFTRIAXONE SODIUM 1 G IJ SOLR
1 g | INTRAMUSCULAR | Status: DC
Start: 2019-10-24 — End: 2019-10-24
  Administered 2019-10-24: 14:00:00 1000 mg via INTRAVENOUS

## 2019-10-24 MED FILL — MIDODRINE HCL 10 MG PO TABS: 10 mg | ORAL | Qty: 1

## 2019-10-24 MED FILL — SPIRONOLACTONE 25 MG PO TABS: 25 mg | ORAL | Qty: 1

## 2019-10-24 MED FILL — FUROSEMIDE 20 MG PO TABS: 20 mg | ORAL | Qty: 1

## 2019-10-24 MED FILL — ENOXAPARIN SODIUM 30 MG/0.3ML SC SOLN: 30 MG/0.3ML | SUBCUTANEOUS | Qty: 0.3

## 2019-10-24 MED FILL — CEFTRIAXONE SODIUM 1 G IJ SOLR: 1 g | INTRAMUSCULAR | Qty: 1000

## 2019-10-24 MED FILL — INSULIN LISPRO 100 UNIT/ML SC SOLN: 100 [IU]/mL | SUBCUTANEOUS | Qty: 2

## 2019-10-24 MED FILL — DULOXETINE HCL 60 MG PO CPEP: 60 mg | ORAL | Qty: 1

## 2019-10-24 MED FILL — INSULIN LISPRO 100 UNIT/ML SC SOLN: 100 [IU]/mL | SUBCUTANEOUS | Qty: 3

## 2019-10-24 NOTE — Progress Notes (Addendum)
Was informed of patient developing new-onset diffuse rash. Patient was seen and examined at bedside. Patient is resting comfortably in bed. Patient stated she noticed a diffuse erythematous rash today afternoon, stated more prominent on her thighs. On exam, patient has a diffuse erythematous maculopapular more prominently noted on her bilateral arms and bilateral thighs. Denied any significant urticaria at this time however does report mild edema on her arms. Denied any SOB, wheezing or respiratory symptoms. Likely delayed drug-related examthem. Will discontinue rocephin at this time. Closely monitor and supportive treatment.     Electronically signed by Baker Pierini, MD on 10/24/2019 at 5:42 PM

## 2019-10-24 NOTE — Progress Notes (Signed)
Physical Therapy    Facility/Department: [facility] PROGRESSIVE CARE  Initial Assessment    NAME: Amy Arellano  DOB: [birth year]  MRN: [id number]    Date of Service: 10/24/2019    Discharge Recommendations:    No further therapy required at discharge.          Assessment   Assessment: Demonstrats safe mobility, including steps, no need for skilled therapy.  Decision Making: Low Complexity  PT Education: PT Role  Patient Education: No need for skilled therapy  REQUIRES PT FOLLOW UP: No  Activity Tolerance  Activity Tolerance: Patient Tolerated treatment well       Patient Diagnosis(es): There were no encounter diagnoses.     has a past medical history of Cancer (HCC) and Diabetes mellitus (HCC).   has no past surgical history on file.    Restrictions  Restrictions/Precautions  Restrictions/Precautions: Fall Risk  Required Braces or Orthoses?: No  Vision/Hearing  Vision: Impaired  Vision Exceptions: Wears glasses for reading  Hearing: Exceptions to [facility]  Hearing Exceptions: Bilateral hearing aid     Subjective  General  Patient assessed for rehabilitation services?: Yes  Additional Pertinent Hx: The patient is a??74 y.o.????Non-hispanic / non latino??female??with a past medical history of steatohepatitis with cirrhosis, cholangiocarcinoma with excision of segment 5/6 of liver, cholecystectomy, noninvasive high-grade papillary urothelial carcinoma and diabetes mellitus,??who presented to the ED with weakness and confusion. States she has had weakness and fatigue for the past 1-2 weeks. Last night patient fell and hit her head - she believes she was going to the bathroom in the middle of the night but is a little confused about the event. CT of head in the ED is unremarkable, EKG is within normal limits. Upon arrival patient was hypotensive, BP 81/55 with tachycardia, HR 127. Magnesium was found to be 1.0 and patient was given replacement. Since then, BP has stabilized at 116/63, HR 91. Patient is denying any nausea, vomiting, diarrhea,  chest pain, SOB, palpitations. Patient denies starting any new medication, apart from Cymbalta one month ago. Patient is being admitted for the management of hypomagnesemia with associated hypotension.??  Family / Caregiver Present: Yes (Spouse)  Referral Date : 10/23/19  Diagnosis: Hypomagnesia  Follows Commands: Within Functional Limits  Subjective  Subjective: reports she feels much better, she was able to get up on her own today.   Pain Screening  Patient Currently in Pain: Denies  Vital Signs  BP Location: Right upper arm  Level of Consciousness: Alert (0)  Patient Currently in Pain: Denies  Oxygen Therapy  O2 Device: None (Room air)       Orientation     Social/Functional History  Social/Functional History  Lives With: Spouse  Type of Home:  (Condo)  Home Layout: Two level, Laundry in basement, 1/2 bath on main level, Bed/Bath upstairs  Home Access: Stairs to enter with rails  Entrance Stairs - Number of Steps: 4 back enterence; 2 steps in fron without HR  Entrance Stairs - Rails: Both  Bathroom Shower/Tub: Hydrographic surveyor, Biochemist, clinical: Midwife: Soil scientist:  (No DME)  ADL Assistance: Independent  Homemaking Assistance: Banker: Yes  Ambulation Assistance: Independent  Transfer Assistance: Independent  Active Driver: Yes  Mode of Transportation: Car  Occupation: Retired  IADL Comments: Pt reports sleeping in flat bed  Additional Comments: Pt reports that spouse is home and able to provide assistance as needed. Pt reports that she is going to stay at one  of her daughters for a couple of days. Pt rerpots that her daughter works from home and has a bedroom on the main floor. Pt reports that her husband just started a full time job.   Cognition        Objective          AROM RLE (degrees)  RLE AROM: WFL  AROM LLE (degrees)  LLE AROM : WFL  AROM RUE (degrees)  RUE General AROM: See OT eval  AROM LUE (degrees)  LUE General AROM:  See OT eval  Strength RLE  Strength RLE: WFL  Strength LLE  Strength LLE: WFL     Sensation  Overall Sensation Status: WFL  Bed mobility  Rolling to Left: Independent  Rolling to Right: Independent  Supine to Sit: Independent  Sit to Supine: Independent  Scooting: Independent  Transfers  Sit to Stand: Supervision  Stand to sit: Supervision  Ambulation  Ambulation?: Yes  Ambulation 1  Surface: level tile  Device: No Device  Assistance: Supervision  Quality of Gait: No gait deviation, steady gait, no dizzyness/lightheadedness  Distance: 180 ft  Stairs/Curb  Stairs?: Yes  Stairs  # Steps : 4  Stairs Height: 8"  Rails: Right ascending  Assistance: Supervision  Comment: One step at a time     Balance  Posture: Good  Sitting - Static: Good  Sitting - Dynamic: Good  Standing - Static: Good  Standing - Dynamic: Good;-        Plan   Plan  Times per week: NA  Safety Devices  Type of devices: Left in bed, Call light within reach    G-Code       OutComes Score                                                  AM-PAC Score  AM-PAC Inpatient Mobility Raw Score : 24 (10/24/19 1334)  AM-PAC Inpatient T-Scale Score : 61.14 (10/24/19 1334)  Mobility Inpatient CMS 0-100% Score: 0 (10/24/19 1334)  Mobility Inpatient CMS G-Code Modifier : CH (10/24/19 1334)          Goals  Short term goals  Time Frame for Short term goals: NA  Patient Goals   Patient goals : NA       Therapy Time   Individual Concurrent Group Co-treatment   Time In 1333         Time Out 1355         Minutes 22                 Birdie Hopes, PT

## 2019-10-24 NOTE — Progress Notes (Signed)
On bedside rounds this afternoon pt alerted nurses to new onset red raised rash that developed this afternoon; not itchy at this time; red raised rash is noted from mid abd, to thighs to above knees, and across buttocks; also noted are edematous feet 1+ that pt states she never has; pt also inquired of husband if her face looks "puffy" to him and he verifies what writer also noticed; pt denies any difficulty breathing or SOB or aches of joints; residents were alerted to above and pt and her husband were informed of this

## 2019-10-24 NOTE — Other (Signed)
10/24/19 6203   Encounter Summary   Services provided to: Patient   Place of Worship [employer] Completed   Spiritual/Religious   Type Spiritual support   Intervention Contacted support as requested per patient/family request   Who? [employer]   Why? spiritual support   At Request Of family/pt

## 2019-10-24 NOTE — Plan of Care (Signed)
Problem: Falls - Risk of:  Goal: Will remain free from falls  Description: Will remain free from falls  Outcome: Met This Shift  Patient remains free from falls during this shift. Bed in lowest position, side rails up x2, bed alarm on, and call light/personal belongings/side table within reach. Patient calls out appropriately with call light for assistance with ambulation.   Goal: Absence of physical injury  Description: Absence of physical injury  Outcome: Met This Shift     Problem: Skin Integrity:  Goal: Demonstration of wound healing without infection will improve  Description: Demonstration of wound healing without infection will improve  Outcome: Ongoing  Patient shows no changes in skin integrity during this shift. Patient calls out appropriately with call light for assistance with ambulation to restroom and is able to make elimination needs known. Skin has remained clean, dry and intact throughout this shift. Staples in place and laceration approximated.

## 2019-10-24 NOTE — Care Coordination-Inpatient (Addendum)
CASE MANAGEMENT NOTE:    Admission Date:  10/23/2019 Ilanna Deihl is a 74 y.o.  female    Admitted for : Hypomagnesemia [E83.42]    Met with:  Patient    PCP:  Dr. Sharlynn Oliphant, MD                                Insurance:  Medicare      Is patient alert and oriented at time of discussion:  Yes    Current Residence/ Living Arrangements:  independently at home             Current Services PTA:  No    Does patient go to outpatient dialysis: No  If yes, location and chair time: N/A    Is patient agreeable to VNS: No    Freedom of choice provided:  Yes    List of [place] provided: No    VNS chosen:  No    DME:  none    Home Oxygen: No    Nebulizer: No    CPAP/BIPAP: No    Supplier: N/A    Potential Assistance Needed: No    SNF needed: No    Freedom of choice and list provided: NA    Pharmacy:  [facility]       Does Patient want to use MEDS to BEDS? No    Is patient currently receiving oral anticoagulation therapy? No    Is the Patient an [facility] with Readmission Risk Score greater than 14%?  No  If yes, pt needs a follow up appointment made within 7 days.    Family Members/Caregivers that pt would like involved in their care:    Yes    If yes, list name here:  Husband Paramedic:  Patient             Discharge Plan:  9/28: MEDICARE - From 2-story home with 2nd bed/bath. States she is independent and drives. DME - None. Declines VNS. IV rocephin, hypomagnesium. Fell at home - PT/OT to see - Patient states she does not need anything. //JP                 Electronically signed by: Cyndi Lennert, RN on 10/24/2019 at 12:10 PM

## 2019-10-24 NOTE — Progress Notes (Signed)
D/w Stpeh,RN pt new onset red rash

## 2019-10-24 NOTE — Other (Signed)
SC visit with patient, patient's husband and son; provided medical update and welcomed prayer     10/24/19 1624   Encounter Summary   Services provided to: Patient and family together   Referral/Consult From: Rounding   Support System Spouse;Children   Continue Visiting   (10/24/19)   Complexity of Encounter Low   Length of Encounter 15 minutes   Spiritual Assessment Completed Yes   Spiritual/Religious   Type Spiritual support   Assessment Approachable;Hopeful;Coping   Intervention Active listening;Explored feelings, thoughts, concerns;Prayer;Sustaining presence/ Ministry of presence;Discussed illness/injury and it's impact   Outcome Expressed gratitude;Engaged in conversation;Expressed feelings/needs/concerns;Coping;Hopeful;Receptive

## 2019-10-24 NOTE — Progress Notes (Addendum)
[place] [facility] [facility]  [facility]   [facility]    PROGRESS NOTE             10/24/2019    1:38 PM    Name:   Amy Arellano  MRN:     [id number]     Acct:      [id number]   Room:   1234567890  IP Day:  1  Admit Date:  10/23/2019  8:37 AM    PCP:  Adalberto Cole, MD  Code Status:  Full Code    Subjective:     C/C:   Chief Complaint   Patient presents with   ??? Hypotension     Interval History Status: improved.    Patient seen and examined at bedside. No acute events overnight.   Patient continues to deny any confusion, dizziness, SOB, chest pain, N/V/D.   Patient was given 2g of magnesium in ED and another 2g once moved to the floor. Repeat magnesium this morning is 2.0.    Brief History:     The patient is a 74 y.o.  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 States she has had weakness and fatigue for the past 1-2 weeks. Last night patient fell and hit her head - she believes she was going to the bathroom in the middle of the night but is a little confused about the event. CT of head in the ED is unremarkable, EKG is within normal limits. Upon arrival patient was hypotensive, BP 81/55 with tachycardia, HR 127. Magnesium was found to be 1.0 and patient was given replacement. Since then, BP has stabilized at 116/63, HR 91. Patient is denying any nausea, vomiting, diarrhea, chest pain, SOB, palpitations. Patient denies starting any new medication, apart from Cymbalta one month ago. Patient is being admitted for the management of hypomagnesemia with associated hypotension.     Review of Systems:     Review of Systems   Constitutional: Negative for chills and fever.   Eyes: Negative for visual disturbance.   Respiratory: Negative  for cough, chest tightness, shortness of breath and wheezing.    Cardiovascular: Negative for chest pain, palpitations and leg swelling.   Gastrointestinal: Negative for abdominal distention, abdominal pain, diarrhea, nausea and vomiting.   Genitourinary: Negative for difficulty urinating and frequency.   Neurological: Negative for dizziness, weakness, light-headedness and headaches.   Psychiatric/Behavioral: Negative for agitation.     Medications:     Allergies:    Allergies   Allergen Reactions   ??? Oxycodone Itching       Current Meds:   Scheduled Meds:   ??? cefTRIAXone (ROCEPHIN) IV  1,000 mg IntraVENous Q24H   ??? sodium chloride flush  5-40 mL IntraVENous 2 times per day   ??? insulin lispro  0-12 Units SubCUTAneous TID WC   ??? insulin lispro  0-6 Units SubCUTAneous Nightly   ??? enoxaparin  30 mg SubCUTAneous Daily   ??? DULoxetine  60 mg Oral Daily   ??? furosemide  20 mg Oral Daily   ??? spironolactone  25 mg Oral Daily     Continuous Infusions:   ??? sodium chloride     ??? sodium chloride 100 mL/hr at 10/24/19 0948     PRN Meds: sodium chloride flush, sodium chloride, ondansetron **  OR** ondansetron, polyethylene glycol, acetaminophen **OR** acetaminophen, midodrine    Data:     Past Medical History:   has a past medical history of Cancer (HCC) and Diabetes mellitus (HCC).    Social History:   reports that she has never smoked. She has never used smokeless tobacco. She reports previous alcohol use. She reports that she does not use drugs.     Family History:   Family History   Family history unknown: Yes       Vitals:  BP 125/69    Pulse 100    Temp 98.1 ??F (36.7 ??C) (Oral)    Resp 18    Ht 4\' 10"  (1.473 m)    Wt 141 lb 8.6 oz (64.2 kg)    SpO2 99%    BMI 29.58 kg/m??   Temp (24hrs), Avg:98.3 ??F (36.8 ??C), Min:97.7 ??F (36.5 ??C), Max:99.3 ??F (37.4 ??C)    Recent Labs     10/23/19  2051 10/24/19  0740 10/24/19  1101   POCGLU 239* 89 193*       I/O(24Hr):    Intake/Output Summary (Last 24 hours) at 10/24/2019 1338  Last data  filed at 10/24/2019 1216  Gross per 24 hour   Intake 3150 ml   Output 2700 ml   Net 450 ml       Labs:  @LABDAILY3 @    Lab Results   Component Value Date/Time    SPECIAL NOT REPORTED 10/23/2019 02:52 PM     Lab Results   Component Value Date/Time    CULTURE NO SIGNIFICANT GROWTH 10/23/2019 02:52 PM       @MLHABGPOC @    Radiology:    CT ABDOMEN PELVIS WO CONTRAST Additional Contrast? None    Result Date: 10/21/2019  EXAMINATION: CT OF THE ABDOMEN AND PELVIS WITHOUT CONTRAST 10/21/2019 4:06 pm TECHNIQUE: CT of the abdomen and pelvis was performed without the administration of intravenous contrast. Multiplanar reformatted images are provided for review. Dose modulation, iterative reconstruction, and/or weight based adjustment of the mA/kV was utilized to reduce the radiation dose to as low as reasonably achievable. COMPARISON: None. HISTORY: ORDERING SYSTEM PROVIDED HISTORY: ab pain TECHNOLOGIST PROVIDED HISTORY: ab pain Decision Support Exception - unselect if not a suspected or confirmed emergency medical condition->Emergency Medical Condition (MA) Reason for Exam: Urinary frequency, lower abdominal pain, recent cystoscopy Acuity: Acute Type of Exam: Initial FINDINGS: Lower Chest: Lung bases are clear.  Mild esophageal wall thickening. Organs: Hypoattenuation liver suggesting fatty infiltration.  Posterior change identified gallbladder fossa.  Otherwise noncontrast of the liver, spleen, adrenal glands, kidneys, pancreas unremarkable. GI/Bowel: Wall thickening of the stomach may relate to underdistention. Otherwise mild retained stool throughout the colon.  No evidence of bowel obstruction.  Colonic diverticulosis.  Appendix unremarkable. Pelvis: Uterus, adnexa, and bladder unremarkable.  Anterior wall nodularity involving the uterus likely representing a focal area of fibroid minimal anterior bladder wall thickening likely reactive.  Small left inguinal hernia identified containing fluid. Peritoneum/Retroperitoneum:  Mild-to-moderate free fluid nonspecific.  Perhaps related to underlying ascites.  No free air.  Ventral abdominal hernia containing loops of bowel noted.  Mild haziness identified involving anterior mesenteric fat may related to 3rd spacing. Bones/Soft Tissues: Degenerate change.  No suspicious osseous lesion.     Mild retained stool.  No bowel obstruction. Moderate colonic diverticulosis. Mild to moderate ascites, nonspecific.     CT HEAD WO CONTRAST    Result Date: 10/23/2019  EXAMINATION: CT OF THE HEAD WITHOUT CONTRAST  10/23/2019 9:12 am  TECHNIQUE: CT of the head was performed without the administration of intravenous contrast. Dose modulation, iterative reconstruction, and/or weight based adjustment of the mA/kV was utilized to reduce the radiation dose to as low as reasonably achievable. COMPARISON: None. HISTORY: ORDERING SYSTEM PROVIDED HISTORY: ams head injury TECHNOLOGIST PROVIDED HISTORY: ams head injury Decision Support Exception - unselect if not a suspected or confirmed emergency medical condition->Emergency Medical Condition (MA) Reason for Exam: fell when getting up today, was disoriented Acuity: Unknown Type of Exam: Unknown FINDINGS: BRAIN/VENTRICLES: There is no acute intracranial hemorrhage, mass effect or midline shift.  No abnormal extra-axial fluid collection.  The gray-white differentiation is maintained without evidence of an acute infarct.  There is no evidence of hydrocephalus. Scattered hypodensity is present in the white matter consistent with chronic microvascular change. ORBITS: The visualized portion of the orbits demonstrate no acute abnormality. SINUSES: The visualized paranasal sinuses and mastoid air cells demonstrate no acute abnormality. SOFT TISSUES/SKULL:  No acute abnormality of the visualized skull or soft tissues.     No acute intracranial abnormality.  Senescent changes including chronic microvascular change are present.     XR CHEST PORTABLE    Result Date:  10/23/2019  EXAMINATION: ONE XRAY VIEW OF THE CHEST 10/23/2019 10:09 am COMPARISON: None. HISTORY: ORDERING SYSTEM PROVIDED HISTORY: hypotension TECHNOLOGIST PROVIDED HISTORY: hypotension Reason for Exam: hypotension Acuity: Acute Type of Exam: Initial FINDINGS: AP portable view of the chest time stamped at 1016 hours demonstrates overlying cardiac monitoring electrodes.  Heart size is normal.  No vascular congestion, focal consolidation, effusion, or pneumothorax is noted.  Osseous and mediastinal structures are age-appropriate.  A small hiatal hernia is noted.     No acute cardiopulmonary process.  Small hiatal hernia.         Physical Examination:        Physical Exam  Constitutional:       Appearance: Normal appearance.   HENT:      Head: Normocephalic. Laceration present.      Comments: 5 staples at site of laceration  Cardiovascular:      Rate and Rhythm: Normal rate and regular rhythm.      Pulses: Normal pulses.      Heart sounds: Normal heart sounds.   Pulmonary:      Effort: Pulmonary effort is normal.      Breath sounds: Normal breath sounds.   Abdominal:      General: A surgical scar is present. There is distension.      Palpations: Abdomen is soft.      Hernia: A hernia is present. Hernia is present in the ventral area.      Comments: Large ventral hernia    Skin:     General: Skin is warm.   Neurological:      General: No focal deficit present.      Mental Status: She is alert.   Psychiatric:         Mood and Affect: Mood normal.       Assessment:        Primary Problem  Hypomagnesemia    Active Hospital Problems    Diagnosis Date Noted   ??? Hypomagnesemia [E83.42] 10/23/2019   ??? Type 2 diabetes mellitus without complication, without long-term current use of insulin (HCC) [E11.9] 10/23/2019   ??? AKI (acute kidney injury) (HCC) [N17.9] 10/23/2019   ??? Hypokalemia [E87.6] 10/23/2019       Plan:        Hypomagnesemia   - Asymptomatic, BP  on admission 81/55  - BP today 109/61  - Elevated troponin, 30, 25 and  lactic acid 2.9->1.1  - Cortisol 87.0  - Mg 1.0 -> 2.0  - Given 2g IV magnesium in ED, another 2g given on floor.   - Monitor BMP   ??  Hypokalemia likely 2/2 hypomagnesemia, improved   - K 3.4 -> 4.5   - Givne PO potassium last night  ??  AKI likely 2/2 to hypotension   - Creatinine 1.41-> 1.35, baseline 0.8  - BUN 22, GFR 37->39 (baseline >60)  - Monitor BMP  - IV NS 120ml/hr  ??  Diabetes mellitus, type 2  - Hold home metformin  - Hold home glimepiride  - Start medium dose sliding scale  - Hypoglycemia protocol in place  ?? DX= lactic acidosis due to Metformin  DVT Prophylaxis - Lovenox    Helyn App, MD  10/24/2019  1:38 PM     Attending Physician Statement    I have discussed the case of Burnett Corrente, including pertinent history and exam findings with the resident. I have seen and examined the patient and the key elements of the encounter have been performed by me. I agree with the assessment, plan, and orders as documented by the resident.  See my comments in her H&P review.  She has improved and she would be started on a different regimen to control of blood sugar.  Electronically signed by Helyn App, MD on 10/24/2019 at 1:39 PM

## 2019-10-24 NOTE — Progress Notes (Signed)
[facility]   Occupational Therapy Evaluation  Date: 10/24/19  Patient Name: Amy Arellano       Room: 2089/2089-01  MRN: [id number]  Account: [id number]   DOB: [birth year]  (74 y.o.) Gender: female     Discharge Recommendations:  The patient's needs are being met with no further Occupational Therapy recommended at discharge.          Referring Practitioner: Fredderick Erb, MD  Diagnosis: Hypomagnesemia          Past Medical History:  has a past medical history of Cancer (HCC) and Diabetes mellitus (HCC).  Past Surgical History:   has no past surgical history on file.    Restrictions  Restrictions/Precautions: Fall Risk  Required Braces or Orthoses?: No     Vitals  Temp: 98.1 ??F (36.7 ??C)  Pulse: 100  Resp: 18  BP: 125/69  Height: 4\' 10"  (147.3 cm)  Weight: 141 lb 8.6 oz (64.2 kg)  BMI (Calculated): 29.6  Oxygen Therapy  SpO2: 99 %  O2 Device: None (Room air)  Blood Pressure Lying: 109/61  Blood Pressure Sitting: 112/63  Blood Pressure Standing: 112/63  Level of Consciousness: Alert (0)    Subjective  Subjective: Pt resting in bed with husband in room upon arrival. Pt was pleasant and agreeable to OT/PT eval  Comments: Ok per Charity fundraiser for OT/PT eval  Overall Orientation Status: Within Functional Limits  Vision  Vision: Impaired  Vision Exceptions: Wears glasses for reading  Hearing  Hearing: Exceptions to [facility]  Hearing Exceptions: Bilateral hearing aid  Social/Functional History  Lives With: Spouse  Type of Home:  (Condo)  Home Layout: Two level, Laundry in basement, 1/2 bath on main level, Bed/Bath upstairs  Home Access: Stairs to enter with rails  Entrance Stairs - Number of Steps: 4 back enterence; 2 steps in fron without HR  Entrance Stairs - Rails: Both  Bathroom Shower/Tub: Hydrographic surveyor, Biochemist, clinical: Midwife: Soil scientist:  (No DME)  ADL Assistance: Independent  Homemaking Assistance: Banker: Yes  Ambulation  Assistance: Independent  Transfer Assistance: Independent  Active Driver: Yes  Mode of Transportation: Car  Occupation: Retired  IADL Comments: Pt reports sleeping in flat bed  Additional Comments: Pt reports that spouse is home and able to provide assistance as needed. Pt reports that she is going to stay at one of her daughters for a couple of days. Pt rerpots that her daughter works from home and has a bedroom on the main floor. Pt reports that her husband just started a full time job.        Objective          Sensation  Overall Sensation Status: WFL   ADL  Feeding: Independent  Grooming: Independent  UE Bathing: Independent  LE Bathing: Independent  UE Dressing: Independent  LE Dressing: Independent  Toileting: Supervision (For IV management)  Additional Comments: ADL scores based on clinical reasoning and skilled observation unless otherwise noted. Pt able to doff/don bilateral socks while sitting EOB without any difficulty noted. Pt denies any concerns with completing self care tasks at this time.     UE Function           LUE Strength  Gross LUE Strength: WFL  L Hand General: 4/5     LUE Tone: Normotonic     LUE AROM (degrees)  LUE AROM : WFL     Left Hand AROM (degrees)  Left Hand AROM: WFL  RUE Strength  Gross RUE Strength: WFL  R Hand General: 4/5      RUE Tone: Normotonic     RUE AROM (degrees)  RUE AROM : WFL     Right Hand AROM (degrees)  Right Hand AROM: WFL    Fine Motor Skills  Coordination  Movements Are Fluid And Coordinated: Yes                           Mobility  Supine to Sit: Independent  Sit to Supine: Independent       Balance  Sitting Balance: Independent  Standing Balance: Independent  Standing Balance  Time: 2-3 minutes  Activity: functional mobility  Comment: without device  Functional Mobility  Functional - Mobility Device: No device  Activity: Other (hallway)  Assist Level: Supervision  Functional Mobility Comments: Pt completed functional mobility in the hall with supervision for IV  management.   Bed mobility  Rolling to Left: Independent  Rolling to Right: Independent  Supine to Sit: Independent  Sit to Supine: Independent  Scooting: Independent     Transfers  Sit to stand: Supervision  Stand to sit: Supervision  Transfer Comments: Pt denies any dizziness with change in position  Functional Activity Tolerance  Functional Activity Tolerance: Tolerates 30 min exercise with multiple rests   Assessment  Assessment: Pt demonstrated independence with bed mobility, lower body dressing, supervision for transfers and functional mobility for IV management. Pt denies any concers with completing self care tasks and functional mobility at this time. No further occupational therapy needed.   Prognosis: Good  Decision Making: Low Complexity  REQUIRES OT FOLLOW UP: No  No Skilled OT: Safe to return home, No OT goals identified  Activity Tolerance: Patient Tolerated treatment well         Functional Outcome Measures  AM-PAC Daily Activity Inpatient   How much help for putting on and taking off regular lower body clothing?: None  How much help for Bathing?: None  How much help for Toileting?: A Little (IV management)  How much help for putting on and taking off regular upper body clothing?: None  How much help for taking care of personal grooming?: None  How much help for eating meals?: None  AM-PAC Inpatient Daily Activity Raw Score: 23  AM-PAC Inpatient ADL T-Scale Score : 51.12  ADL Inpatient CMS 0-100% Score: 15.86  ADL Inpatient CMS G-Code Modifier : CI       Goals  Short term goals  Time Frame for Short term goals: D/C OT    Plan  Safety Devices  Safety Devices in place: Yes  Type of devices: Left in bed, Call light within reach     Plan  Times per week: D/C OT          OT Individual Minutes  Time In: 1333  Time Out: 1355  Minutes: 22    Electronically signed by Corky Sox, OT on 10/24/19 at 3:50 PM EDT

## 2019-10-24 NOTE — Plan of Care (Signed)
Problem: Falls - Risk of:  Goal: Will remain free from falls  Description: Will remain free from falls  Outcome: Met This Shift  Goal: Absence of physical injury  Description: Absence of physical injury  Outcome: Met This Shift     Problem: Physical Regulation:  Goal: Will remain free from infection  Description: Will remain free from infection  Outcome: Met This Shift     Problem: Skin Integrity:  Goal: Complications related to intravenous access or infusion will be avoided or minimized  Description: Complications related to intravenous access or infusion will be avoided or minimized  Outcome: Met This Shift     Problem: Skin Integrity:  Goal: Demonstration of wound healing without infection will improve  Description: Demonstration of wound healing without infection will improve  Outcome: Ongoing  Note: Head laceration intact with staples

## 2019-10-25 LAB — HEMOGLOBIN A1C
Estimated Avg Glucose: 183 mg/dL
Hemoglobin A1C: 8 % — ABNORMAL HIGH (ref 4.0–6.0)

## 2019-10-25 LAB — CBC
Hematocrit: 32.3 % — ABNORMAL LOW (ref 36–46)
Hemoglobin: 10.6 g/dL — ABNORMAL LOW (ref 12.0–16.0)
MCH: 28.7 pg (ref 26–34)
MCHC: 32.8 g/dL (ref 31–37)
MCV: 87.7 fL (ref 80–100)
MPV: 7.7 fL (ref 6.0–12.0)
Platelets: 173 10*3/uL (ref 150–450)
RBC: 3.69 m/uL — ABNORMAL LOW (ref 4.0–5.2)
RDW: 17.1 % — ABNORMAL HIGH (ref 11.5–14.9)
WBC: 5.3 10*3/uL (ref 3.5–11.0)

## 2019-10-25 LAB — BASIC METABOLIC PANEL
Anion Gap: 13 mmol/L (ref 9–17)
BUN: 32 mg/dL — ABNORMAL HIGH (ref 8–23)
CO2: 22 mmol/L (ref 20–31)
Calcium: 10 mg/dL (ref 8.6–10.4)
Chloride: 104 mmol/L (ref 98–107)
Creatinine: 1.24 mg/dL — ABNORMAL HIGH (ref 0.50–0.90)
GFR African American: 52 mL/min — ABNORMAL LOW (ref >60)
GFR Non-African American: 43 mL/min — ABNORMAL LOW (ref >60)
Glucose: 98 mg/dL (ref 70–99)
Potassium: 4.3 mmol/L (ref 3.7–5.3)
Sodium: 139 mmol/L (ref 135–144)

## 2019-10-25 LAB — MAGNESIUM
Magnesium: 1.4 mg/dL — ABNORMAL LOW (ref 1.6–2.6)
Magnesium: 1.7 mg/dL (ref 1.6–2.6)

## 2019-10-25 LAB — POC GLUCOSE FINGERSTICK
POC Glucose: 138 mg/dL — ABNORMAL HIGH (ref 65–105)
POC Glucose: 96 mg/dL (ref 65–105)
POC Glucose: 196 mg/dL — ABNORMAL HIGH (ref 65–105)

## 2019-10-25 LAB — PTH, INTACT: Pth Intact: 13.03 pg/mL — ABNORMAL LOW (ref 15.0–65.0)

## 2019-10-25 MED ORDER — DEXTROSE 5 % IV SOLN
5 % | INTRAVENOUS | Status: DC | PRN
Start: 2019-10-25 — End: 2019-10-25

## 2019-10-25 MED ORDER — MAGNESIUM SULFATE IN D5W 1-5 GM/100ML-% IV SOLN
1-5100- GM/100ML-% | INTRAVENOUS | Status: AC
Start: 2019-10-25 — End: 2019-10-25
  Administered 2019-10-25: 16:00:00 1000 mg via INTRAVENOUS

## 2019-10-25 MED ORDER — GLUCOSE 40 % PO GEL
40 % | ORAL | Status: DC | PRN
Start: 2019-10-25 — End: 2019-10-25

## 2019-10-25 MED ORDER — MAGNESIUM OXIDE 400 MG PO CAPS
400 | ORAL_CAPSULE | Freq: Two times a day (BID) | ORAL | 0 refills | Status: AC
Start: 2019-10-25 — End: 2019-11-08

## 2019-10-25 MED ORDER — GLUCAGON HCL RDNA (DIAGNOSTIC) 1 MG IJ SOLR
1 MG | INTRAMUSCULAR | Status: DC | PRN
Start: 2019-10-25 — End: 2019-10-25

## 2019-10-25 MED ORDER — CANAGLIFLOZIN 100 MG PO TABS
100 MG | ORAL_TABLET | Freq: Every day | ORAL | 0 refills | Status: DC
Start: 2019-10-25 — End: 2021-12-25

## 2019-10-25 MED ORDER — DEXTROSE 50 % IV SOLN
50 | INTRAVENOUS | Status: DC | PRN
Start: 2019-10-25 — End: 2019-10-25

## 2019-10-25 MED ORDER — CANAGLIFLOZIN 100 MG PO TABS
100 MG | Freq: Every day | ORAL | Status: DC
Start: 2019-10-25 — End: 2019-10-25

## 2019-10-25 MED FILL — FUROSEMIDE 20 MG PO TABS: 20 mg | ORAL | Qty: 1

## 2019-10-25 MED FILL — INSULIN LISPRO 100 UNIT/ML SC SOLN: 100 [IU]/mL | SUBCUTANEOUS | Qty: 2

## 2019-10-25 MED FILL — DULOXETINE HCL 60 MG PO CPEP: 60 mg | ORAL | Qty: 1

## 2019-10-25 MED FILL — SPIRONOLACTONE 25 MG PO TABS: 25 mg | ORAL | Qty: 1

## 2019-10-25 MED FILL — ENOXAPARIN SODIUM 30 MG/0.3ML SC SOLN: 30 MG/0.3ML | SUBCUTANEOUS | Qty: 0.3

## 2019-10-25 MED FILL — INVOKANA 100 MG PO TABS: 100 mg | ORAL | Qty: 1

## 2019-10-25 MED FILL — MIDODRINE HCL 10 MG PO TABS: 10 mg | ORAL | Qty: 1

## 2019-10-25 MED FILL — MAGNESIUM SULFATE IN D5W 1-5 GM/100ML-% IV SOLN: 1-5 GM/100ML-% | INTRAVENOUS | Qty: 100

## 2019-10-25 MED FILL — BANOPHEN 25 MG PO TABS: 25 mg | ORAL | Qty: 1

## 2019-10-25 NOTE — Discharge Instructions (Signed)
Continuity of Care Form    Patient Name: Amy Arellano   DOB:  1945-07-21  MRN:  [id number]    Admit date:  10/23/2019  Discharge date:  10/25/2019    Code Status Order: Full Code   Advance Directives:     Admitting Physician:  Amy App, MD  PCP: Amy Cole, MD    Discharging Nurse: Amy Arellano Unit/Room#: 2089/2089-01  Discharging Unit Phone Number: [phone]    Emergency Contact:   Extended Emergency Contact Information  Primary Emergency Contact: Amy, Arellano  Home Phone: [phone]  Mobile Phone: [phone]  Relation: Spouse  Preferred language: English  Interpreter needed? No    Past Surgical History:  History reviewed. No pertinent surgical history.    Immunization History:   Immunization History   Administered Date(s) Administered   ??? COVID-19, Pfizer, PF, 68mcg/0.3mL 04/19/2019   ??? Tdap (Boostrix, Adacel) 10/23/2019       Active Problems:  Patient Active Problem List   Diagnosis Code   ??? Hypomagnesemia E83.42   ??? Type 2 diabetes mellitus without complication, without long-term current use of insulin (HCC) E11.9   ??? 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 Bladder cancer (HCC) C67.9   ??? AKI (acute kidney injury) (HCC) N17.9   ??? Hypokalemia E87.6   ??? Acute cystitis without hematuria N30.00       Isolation/Infection:   Isolation          No Isolation        Patient Infection Status     None to display          Nurse Assessment:  Last Vital Signs: BP (!) 106/52    Pulse 96    Temp 97.7 ??F (36.5 ??C) (Oral)    Resp 16    Ht 4\' 10"  (1.473 m)    Wt 137 lb 12.6 oz (62.5 kg)    SpO2 96%    BMI 28.80 kg/m??     Last documented pain score (0-10 scale): Pain Level: 0  Last Weight:   Wt Readings from Last 1 Encounters:   10/25/19 137 lb 12.6 oz (62.5 kg)     Mental Status:  oriented and alert    IV Access:  - None    Nursing Mobility/ADLs:  Walking   Independent  Transfer  Independent  Bathing  Independent  Dressing  Independent  Toileting  Independent  Feeding  Independent  Med Admin   Independent  Med Delivery   none    Wound Care Documentation and Therapy:  Wound 10/24/19 Head Posterior;Left;Other (Comment) laceration well approximatated,statpes intact; no drainage,old dry bloody  drainage (Active)   Wound Etiology Traumatic 10/25/19 0850   Dressing Status Dry 10/25/19 0850   Dressing/Treatment Open to air;Other (comment) 10/25/19 0850   Wound Assessment Dry 10/25/19 0850   Drainage Amount None 10/25/19 0850   Drainage Description Other (Comment) 10/25/19 0850   Odor None 10/25/19 0850   Number of days: 1        Elimination:  Continence:   ?? Bowel: Yes  ?? Bladder: Yes  Urinary Catheter: None   Colostomy/Ileostomy/Ileal Conduit: No       Date of Last BM: 10/24/2019    Intake/Output Summary (Last 24 hours) at 10/25/2019 1545  Last data filed at 10/25/2019 1330  Gross per 24 hour   Intake 6164 ml   Output --   Net 6164 ml     I/O last 3 completed shifts:  In: 6164 [P.O.:2150;  I.V.:4014]  Out: -     Safety Concerns:     At Risk for Falls, fell within 30 days    Impairments/Disabilities:      None    Nutrition Therapy:  Current Nutrition Therapy:   - Oral Diet:  General    Routes of Feeding: Oral  Liquids: No Restrictions  Daily Fluid Restriction: no  Last Modified Barium Swallow with Video (Video Swallowing Test): not done    Treatments at the Time of Hospital Discharge:   Respiratory Treatments: N/A  Oxygen Therapy:  is not on home oxygen therapy.  Ventilator:    - No ventilator support    Rehab Therapies: Physical Therapy  Weight Bearing Status/Restrictions: No weight bearing restirctions  Other Medical Equipment (for information only, NOT a DME order):  n/a  Other Treatments: ***    Patient's personal belongings (please select all that are sent with patient):  Phone, IPad.    RN SIGNATURE:  Electronically signed by Amy Ligas, RN on 10/25/19 at 3:49 PM EDT    CASE MANAGEMENT/SOCIAL WORK SECTION    Inpatient Status Date: ***    Readmission Risk Assessment Score:  Readmission Risk              Risk  of Unplanned Readmission:  20           Discharging to Facility/ Agency   ?? Name:   ?? Address:  ?? Phone:  ?? Fax:    Dialysis Facility (if applicable)   ?? Name:  ?? Address:  ?? Dialysis Schedule:  ?? Phone:  ?? Fax:    Case Manager/Social Worker signature: {Esignature:[id number]}    PHYSICIAN SECTION    Prognosis: {Prognosis:[phone]}    Condition at Discharge: {MH Patient Condition:304088024}    Rehab Potential (if transferring to Rehab): {Prognosis:[phone]}    Recommended Labs or Other Treatments After Discharge: ***    Physician Certification: I certify the above information and transfer of Amy Arellano  is necessary for the continuing treatment of the diagnosis listed and that she requires {Admit to Appropriate Level of Care:20763} for {GREATER/LESS:304500278} 30 days.     Update Admission H&P: {CHP DME Changes in FXJOI:325498264}    PHYSICIAN SIGNATURE:  {Esignature:[id number]}

## 2019-10-25 NOTE — Progress Notes (Signed)
Patient dc'd with VS WNL, all belongings via WC per daughter and PCT Jill Alexanders.

## 2019-10-25 NOTE — Discharge Summary (Signed)
[facility]   IN-PATIENT SERVICE   Amy Arellano - [facility]    Discharge Summary     Patient ID: Amy Arellano  DOB:  Aug 05, 1945   MRN: [id number]     ACCOUNT:  [id number]   Patient's PCP: Adalberto Cole, MD  Admit Date: 10/23/2019   Discharge Date: 10/25/2019     Length of Stay: 2  Code Status:  Full Code  Admitting Physician: Helyn App, MD  Discharge Physician: Sinclair Ship, MD     Active Discharge Diagnoses:       Primary Problem  Hypomagnesemia      Hospital Problems  Active Hospital Problems    Diagnosis Date Noted   ??? Acute cystitis without hematuria [N30.00]    ??? Hypomagnesemia [E83.42] 10/23/2019   ??? Type 2 diabetes mellitus without complication, without long-term current use of insulin (HCC) [E11.9] 10/23/2019   ??? AKI (acute kidney injury) (HCC) [N17.9] 10/23/2019   ??? Hypokalemia [E87.6] 10/23/2019       Admission Condition:  fair     Discharged Condition: good    Hospital Stay:       Hospital Course:  Amy Arellano is a 74 y.o. female who presented to the ED with a 1-2 week history of fatigue. Patient had fallen at her daughters house and hit her head, patient had admitted she felt confused and wasn't sure where she was at that time. In ED, head CT was unremarkable, head laceration was stapled. Blood pressure was 81/55, which was managed with IV fluids. Labs showed magnesium level of 1.0 and mild non anion gap lactic acidosis. Patient was given 2g IV of magnesium in the ED and another 2g IV once on the floor. Next day magnesium level was 2.0, but dropped again to 1.4, requiring another 2g of magnesium. Medications were carefully assessed, patient instructed to stop taking metformin, Actos and Wellbutrin. Started patient on Invokana and magnesium. Patient may require further workup for recurring low magnesium.     Patient also developed AKI, managed with IVF and suspected acute cystitis, which was treated empirically with antibiotics. Urine culture was negative ruling out cystitis.      Significant therapeutic interventions: IV magnesium, IVF.    Significant Diagnostic Studies:   Labs / Micro:  CBC:   Lab Results   Component Value Date    WBC 5.3 10/25/2019    RBC 3.69 10/25/2019    HGB 10.6 10/25/2019    HCT 32.3 10/25/2019    MCV 87.7 10/25/2019    MCH 28.7 10/25/2019    MCHC 32.8 10/25/2019    RDW 17.1 10/25/2019    PLT 173 10/25/2019     BMP:    Lab Results   Component Value Date    GLUCOSE 98 10/25/2019    NA 139 10/25/2019    K 4.3 10/25/2019    CL 104 10/25/2019    CO2 22 10/25/2019    ANIONGAP 13 10/25/2019    BUN 32 10/25/2019    CREATININE 1.24 10/25/2019    BUNCRER NOT REPORTED 10/25/2019    CALCIUM 10.0 10/25/2019    LABGLOM 43 10/25/2019    GFRAA 52 10/25/2019    GFR      10/25/2019    GFR NOT REPORTED 10/25/2019     U/A:    Lab Results   Component Value Date    COLORU Dark Yellow 10/23/2019    TURBIDITY Cloudy 10/23/2019    SPECGRAV 1.014 10/23/2019    HGBUR NEGATIVE  10/23/2019    PHUR 5.0 10/23/2019    PROTEINU 1+ 10/23/2019    GLUCOSEU NEGATIVE 10/23/2019    KETUA TRACE 10/23/2019    BILIRUBINUR SMALL 10/23/2019    UROBILINOGEN Normal 10/23/2019    NITRU NEGATIVE 10/23/2019    LEUKOCYTESUR MOD 10/23/2019       Radiology:    CT ABDOMEN PELVIS WO CONTRAST Additional Contrast? None    Result Date: 10/21/2019  EXAMINATION: CT OF THE ABDOMEN AND PELVIS WITHOUT CONTRAST 10/21/2019 4:06 pm TECHNIQUE: CT of the abdomen and pelvis was performed without the administration of intravenous contrast. Multiplanar reformatted images are provided for review. Dose modulation, iterative reconstruction, and/or weight based adjustment of the mA/kV was utilized to reduce the radiation dose to as low as reasonably achievable. COMPARISON: None. HISTORY: ORDERING SYSTEM PROVIDED HISTORY: ab pain TECHNOLOGIST PROVIDED HISTORY: ab pain Decision Support Exception - unselect if not a suspected or confirmed emergency medical condition->Emergency Medical Condition (MA) Reason for Exam: Urinary frequency, lower  abdominal pain, recent cystoscopy Acuity: Acute Type of Exam: Initial FINDINGS: Lower Chest: Lung bases are clear.  Mild esophageal wall thickening. Organs: Hypoattenuation liver suggesting fatty infiltration.  Posterior change identified gallbladder fossa.  Otherwise noncontrast of the liver, spleen, adrenal glands, kidneys, pancreas unremarkable. GI/Bowel: Wall thickening of the stomach may relate to underdistention. Otherwise mild retained stool throughout the colon.  No evidence of bowel obstruction.  Colonic diverticulosis.  Appendix unremarkable. Pelvis: Uterus, adnexa, and bladder unremarkable.  Anterior wall nodularity involving the uterus likely representing a focal area of fibroid minimal anterior bladder wall thickening likely reactive.  Small left inguinal hernia identified containing fluid. Peritoneum/Retroperitoneum: Mild-to-moderate free fluid nonspecific.  Perhaps related to underlying ascites.  No free air.  Ventral abdominal hernia containing loops of bowel noted.  Mild haziness identified involving anterior mesenteric fat may related to 3rd spacing. Bones/Soft Tissues: Degenerate change.  No suspicious osseous lesion.     Mild retained stool.  No bowel obstruction. Moderate colonic diverticulosis. Mild to moderate ascites, nonspecific.     CT HEAD WO CONTRAST    Result Date: 10/23/2019  EXAMINATION: CT OF THE HEAD WITHOUT CONTRAST  10/23/2019 9:12 am TECHNIQUE: CT of the head was performed without the administration of intravenous contrast. Dose modulation, iterative reconstruction, and/or weight based adjustment of the mA/kV was utilized to reduce the radiation dose to as low as reasonably achievable. COMPARISON: None. HISTORY: ORDERING SYSTEM PROVIDED HISTORY: ams head injury TECHNOLOGIST PROVIDED HISTORY: ams head injury Decision Support Exception - unselect if not a suspected or confirmed emergency medical condition->Emergency Medical Condition (MA) Reason for Exam: fell when getting up today,  was disoriented Acuity: Unknown Type of Exam: Unknown FINDINGS: BRAIN/VENTRICLES: There is no acute intracranial hemorrhage, mass effect or midline shift.  No abnormal extra-axial fluid collection.  The gray-white differentiation is maintained without evidence of an acute infarct.  There is no evidence of hydrocephalus. Scattered hypodensity is present in the white matter consistent with chronic microvascular change. ORBITS: The visualized portion of the orbits demonstrate no acute abnormality. SINUSES: The visualized paranasal sinuses and mastoid air cells demonstrate no acute abnormality. SOFT TISSUES/SKULL:  No acute abnormality of the visualized skull or soft tissues.     No acute intracranial abnormality.  Senescent changes including chronic microvascular change are present.     XR CHEST PORTABLE    Result Date: 10/23/2019  EXAMINATION: ONE XRAY VIEW OF THE CHEST 10/23/2019 10:09 am COMPARISON: None. HISTORY: ORDERING SYSTEM PROVIDED HISTORY: hypotension TECHNOLOGIST PROVIDED HISTORY: hypotension Reason  for Exam: hypotension Acuity: Acute Type of Exam: Initial FINDINGS: AP portable view of the chest time stamped at 1016 hours demonstrates overlying cardiac monitoring electrodes.  Heart size is normal.  No vascular congestion, focal consolidation, effusion, or pneumothorax is noted.  Osseous and mediastinal structures are age-appropriate.  A small hiatal hernia is noted.     No acute cardiopulmonary process.  Small hiatal hernia.         Consultations:    Consults:     Final Specialist Recommendations/Findings:   IP CONSULT TO INTERNAL MEDICINE  IP CONSULT TO SOCIAL WORK      The patient was seen and examined on day of discharge and this discharge summary is in conjunction with any daily progress note from day of discharge.    Discharge plan:       Disposition: Home    Physician Follow Up:     Adalberto Cole, MD  [address]  [place] [address]  [phone]    In 1 week         Requiring Further  Evaluation/Follow Up POST HOSPITALIZATION/Incidental Findings: Follow up with PCP    Diet: regular diet    Activity: As tolerated    Instructions to Patient: Follow up with PCP    Discharge Medications:      Medication List      START taking these medications    canagliflozin 100 MG Tabs tablet  Commonly known as: INVOKANA  Take 1 tablet by mouth every morning (before breakfast)     Magnesium Oxide 400 MG Caps  Take 400 mg by mouth 2 times daily for 14 days        CONTINUE taking these medications    Cholecalciferol 50 MCG (2000 UT) Tabs     DULoxetine 60 MG extended release capsule  Commonly known as: CYMBALTA     furosemide 20 MG tablet  Commonly known as: LASIX     glimepiride 2 MG tablet  Commonly known as: AMARYL     polyethylene glycol 17 g packet  Commonly known as: MiraLax  Take 17 g by mouth daily as needed for Constipation     spironolactone 50 MG tablet  Commonly known as: ALDACTONE        STOP taking these medications    buPROPion 150 MG extended release tablet  Commonly known as: WELLBUTRIN XL     metFORMIN 1000 MG tablet  Commonly known as: GLUCOPHAGE     pioglitazone 15 MG tablet  Commonly known as: ACTOS           Where to Get Your Medications      These medications were sent to [id number] - [place], [place] - [address] - P [phone] Carmon Ginsberg [phone]  [address], [place] [address]    Phone: [phone]   ?? canagliflozin 100 MG Tabs tablet  ?? Magnesium Oxide 400 MG Caps         Time Spent on discharge is  35 mins in patient examination, evaluation, counseling as well as medication reconciliation, prescriptions for required medications, discharge plan and follow up.    Electronically signed by   Sinclair Ship, MD  10/25/2019  5:15 PM      Thank you Dr. Adalberto Cole, MD for the opportunity to be involved in this patient's care.

## 2019-10-25 NOTE — Care Coordination-Inpatient (Signed)
ONGOING DISCHARGE PLAN:    Patient is alert and oriented x4.    Spoke with patient regarding discharge plan and patient confirms that plan is still to DC to home w/ No needs.    Denies VNS.    PT/OT on board, OT rec Home w/ Assist PRN.    Pt. States, she is independent.    MG+ today. 1.4, replacing.     ?DC later today.    Will continue to follow for additional discharge needs.    Electronically signed by Harold Hedge, RN on 10/25/2019 at 10:17 AM

## 2019-10-25 NOTE — Plan of Care (Signed)
Problem: Falls - Risk of:  Goal: Will remain free from falls  Description: Will remain free from falls  10/25/2019 0403 by Hennie Duos, RN  Outcome: Ongoing  Patient remains free from falls during this shift. Bed in lowest position, side rails up x2, call light/personal belongings/side table within reach. Patient calls out appropriately with call light for assistance with ambulation.   Goal: Absence of physical injury  Description: Absence of physical injury  10/25/2019 0403 by Hennie Duos, RN  Outcome: Ongoing     Problem: Physical Regulation:  Goal: Will remain free from infection  Description: Will remain free from infection  10/25/2019 0403 by Hennie Duos, RN  Outcome: Ongoing     Problem: Skin Integrity:  Goal: Demonstration of wound healing without infection will improve  Description: Demonstration of wound healing without infection will improve  10/25/2019 0403 by Hennie Duos, RN  Outcome: Ongoing  Patient shows no new signs of skin breakdown during this shift. Patient calls out appropriately with call light for assistance with ambulation to restroom and is able to make elimination needs known, avoiding accidents. Skin has remained clean, dry and intact throughout this shift. Staples assessed and are clean, dry and intact.  Goal: Complications related to intravenous access or infusion will be avoided or minimized  Description: Complications related to intravenous access or infusion will be avoided or minimized  10/25/2019 0403 by Hennie Duos, RN  Outcome: Ongoing

## 2019-10-25 NOTE — Progress Notes (Addendum)
[place] [facility]  [facility]   [facility]    PROGRESS NOTE             10/25/2019    7:31 AM    Name:   Amy Arellano  MRN:     [id number]     Acct:      [id number]   Room:   [id number]  IP Day:  2  Admit Date:  10/23/2019  8:37 AM    PCP:  Adalberto Cole, MD  Code Status:  Full Code    Subjective:     C/C:   Chief Complaint   Patient presents with   ??? Hypotension     Interval History Status: improved.      Review of Systems:     Review of Systems   Constitutional: Negative for chills and fever.   Eyes: Negative for visual disturbance.   Respiratory: Negative for cough, chest tightness, shortness of breath and wheezing.    Gastrointestinal: Negative for abdominal distention, abdominal pain, diarrhea, nausea and vomiting.   Genitourinary: Negative for difficulty urinating and dysuria.   Neurological: Negative for dizziness, weakness, light-headedness and headaches.   Psychiatric/Behavioral: Negative for agitation.         Medications:     Allergies:    Allergies   Allergen Reactions   ??? Oxycodone Itching   ??? Rocephin [Ceftriaxone] Rash     Developed a diffuse, widespread maculopapular rash 1 day after starting ceftriaxone with minimal urticaria/edema.       Current Meds:   Scheduled Meds:   ??? sodium chloride flush  5-40 mL IntraVENous 2 times per day   ??? insulin lispro  0-12 Units SubCUTAneous TID WC   ??? insulin lispro  0-6 Units SubCUTAneous Nightly   ??? enoxaparin  30 mg SubCUTAneous Daily   ??? DULoxetine  60 mg Oral Daily   ??? furosemide  20 mg Oral Daily   ??? spironolactone  25 mg Oral Daily     Continuous Infusions:   ??? sodium chloride     ??? sodium chloride 100 mL/hr at 10/25/19 0235     PRN Meds: diphenhydrAMINE, sodium chloride flush, sodium chloride, ondansetron **OR** ondansetron, polyethylene glycol, acetaminophen **OR** acetaminophen, midodrine    Data:     Past Medical History:   has a past medical history of Cancer (HCC) and Diabetes mellitus (HCC).    Social  History:   reports that she has never smoked. She has never used smokeless tobacco. She reports previous alcohol use. She reports that she does not use drugs.     Family History:   Family History   Family history unknown: Yes       Vitals:  BP 120/62    Pulse 105    Temp 99.1 ??F (37.3 ??C) (Oral)    Resp 16    Ht 4\' 10"  (1.473 m)    Wt 137 lb 12.6 oz (62.5 kg)    SpO2 97%    BMI 28.80 kg/m??   Temp (24hrs), Avg:98.4 ??F (36.9 ??C), Min:98.1 ??F (36.7 ??C), Max:99.1 ??F (37.3 ??C)    Recent Labs     10/24/19  1101 10/24/19  1602 10/24/19  2145 10/25/19  0627   POCGLU 193* 115* 138* 96       I/O(24Hr):    Intake/Output Summary (Last 24 hours) at 10/25/2019 0731  Last data filed at 10/25/2019 0629  Gross per 24 hour   Intake 6304 ml  Output 1200 ml   Net 5104 ml       Labs:  @LABDAILY3 @    Lab Results   Component Value Date/Time    SPECIAL NOT REPORTED 10/23/2019 02:52 PM     Lab Results   Component Value Date/Time    CULTURE NO SIGNIFICANT GROWTH 10/23/2019 02:52 PM       @MLHABGPOC @    Radiology:    CT ABDOMEN PELVIS WO CONTRAST Additional Contrast? None    Result Date: 10/21/2019  EXAMINATION: CT OF THE ABDOMEN AND PELVIS WITHOUT CONTRAST 10/21/2019 4:06 pm TECHNIQUE: CT of the abdomen and pelvis was performed without the administration of intravenous contrast. Multiplanar reformatted images are provided for review. Dose modulation, iterative reconstruction, and/or weight based adjustment of the mA/kV was utilized to reduce the radiation dose to as low as reasonably achievable. COMPARISON: None. HISTORY: ORDERING SYSTEM PROVIDED HISTORY: ab pain TECHNOLOGIST PROVIDED HISTORY: ab pain Decision Support Exception - unselect if not a suspected or confirmed emergency medical condition->Emergency Medical Condition (MA) Reason for Exam: Urinary frequency, lower abdominal pain, recent cystoscopy Acuity: Acute Type of Exam: Initial FINDINGS: Lower Chest: Lung bases are clear.  Mild esophageal wall thickening. Organs: Hypoattenuation  liver suggesting fatty infiltration.  Posterior change identified gallbladder fossa.  Otherwise noncontrast of the liver, spleen, adrenal glands, kidneys, pancreas unremarkable. GI/Bowel: Wall thickening of the stomach may relate to underdistention. Otherwise mild retained stool throughout the colon.  No evidence of bowel obstruction.  Colonic diverticulosis.  Appendix unremarkable. Pelvis: Uterus, adnexa, and bladder unremarkable.  Anterior wall nodularity involving the uterus likely representing a focal area of fibroid minimal anterior bladder wall thickening likely reactive.  Small left inguinal hernia identified containing fluid. Peritoneum/Retroperitoneum: Mild-to-moderate free fluid nonspecific.  Perhaps related to underlying ascites.  No free air.  Ventral abdominal hernia containing loops of bowel noted.  Mild haziness identified involving anterior mesenteric fat may related to 3rd spacing. Bones/Soft Tissues: Degenerate change.  No suspicious osseous lesion.     Mild retained stool.  No bowel obstruction. Moderate colonic diverticulosis. Mild to moderate ascites, nonspecific.     CT HEAD WO CONTRAST    Result Date: 10/23/2019  EXAMINATION: CT OF THE HEAD WITHOUT CONTRAST  10/23/2019 9:12 am TECHNIQUE: CT of the head was performed without the administration of intravenous contrast. Dose modulation, iterative reconstruction, and/or weight based adjustment of the mA/kV was utilized to reduce the radiation dose to as low as reasonably achievable. COMPARISON: None. HISTORY: ORDERING SYSTEM PROVIDED HISTORY: ams head injury TECHNOLOGIST PROVIDED HISTORY: ams head injury Decision Support Exception - unselect if not a suspected or confirmed emergency medical condition->Emergency Medical Condition (MA) Reason for Exam: fell when getting up today, was disoriented Acuity: Unknown Type of Exam: Unknown FINDINGS: BRAIN/VENTRICLES: There is no acute intracranial hemorrhage, mass effect or midline shift.  No abnormal  extra-axial fluid collection.  The gray-white differentiation is maintained without evidence of an acute infarct.  There is no evidence of hydrocephalus. Scattered hypodensity is present in the white matter consistent with chronic microvascular change. ORBITS: The visualized portion of the orbits demonstrate no acute abnormality. SINUSES: The visualized paranasal sinuses and mastoid air cells demonstrate no acute abnormality. SOFT TISSUES/SKULL:  No acute abnormality of the visualized skull or soft tissues.     No acute intracranial abnormality.  Senescent changes including chronic microvascular change are present.     XR CHEST PORTABLE    Result Date: 10/23/2019  EXAMINATION: ONE XRAY VIEW OF THE CHEST 10/23/2019 10:09 am COMPARISON:  None. HISTORY: ORDERING SYSTEM PROVIDED HISTORY: hypotension TECHNOLOGIST PROVIDED HISTORY: hypotension Reason for Exam: hypotension Acuity: Acute Type of Exam: Initial FINDINGS: AP portable view of the chest time stamped at 1016 hours demonstrates overlying cardiac monitoring electrodes.  Heart size is normal.  No vascular congestion, focal consolidation, effusion, or pneumothorax is noted.  Osseous and mediastinal structures are age-appropriate.  A small hiatal hernia is noted.     No acute cardiopulmonary process.  Small hiatal hernia.         Physical Examination:        Physical Exam  Constitutional:       Appearance: Normal appearance.   HENT:      Head: Normocephalic.   Cardiovascular:      Rate and Rhythm: Normal rate and regular rhythm.      Pulses: Normal pulses.      Heart sounds: Normal heart sounds.   Pulmonary:      Effort: Pulmonary effort is normal.      Breath sounds: Normal breath sounds.   Abdominal:      Palpations: Abdomen is soft.      Comments: Large ventral hernia in epigastric region   Skin:     General: Skin is warm.   Neurological:      General: No focal deficit present.      Mental Status: She is alert and oriented to person, place, and time.   Psychiatric:          Mood and Affect: Mood normal.         Assessment:        Primary Problem  Hypomagnesemia    Active Hospital Problems    Diagnosis Date Noted   ??? Hypomagnesemia [E83.42] 10/23/2019   ??? Type 2 diabetes mellitus without complication, without long-term current use of insulin (HCC) [E11.9] 10/23/2019   ??? AKI (acute kidney injury) (HCC) [N17.9] 10/23/2019   ??? Hypokalemia [E87.6] 10/23/2019       Plan:          Hypomagnesemia  - Asymptomatic, BP on admission 81/55  - BP today 120/69  - Mg 1.0 -> 2.0-> 1.4   - Another 2g IV magnesium ordered  ??  Hypokalemia likely 2/2 hypomagnesemia, resolved  - K 4.3  ??  AKI likely 2/2 to hypotension, improving   - Creatinine 1.35-> 1.24, baseline 0.8  - BUN 22, GFR 39-> 43 (baseline >60)  - Monitor BMP  - IV NS 174ml/hr  ??  Diabetes mellitus, type 2  - Discontinue metformin  - Hold home glimepiride  - Will start on Januvia upon discharge   - On medium dose sliding scale  - Hypoglycemia protocol in place  - Lactic acidosis due to Metformin    DVT Prophylaxis - Lovenox    Sinclair Ship, MD  10/25/2019  7:31 AM   Attending Physician Statement    I have discussed the case of Amy Arellano, including pertinent history and exam findings with the resident. I have seen and examined the patient and the key elements of the encounter have been performed by me. I agree with the assessment, plan, and orders as documented by the resident.  The patient is asymptomatic.  Her magnesium is still low which will be replaced.  She can be discharged today she will be started on SGLT 2  Electronically signed by Helyn App, MD on 10/25/2019 at 10:24 AM

## 2019-10-25 NOTE — Progress Notes (Signed)
Spoke with Dr. Brendolyn Patty on patient being started on Jardiance, pharmacy does not have medication. Notified able to start patient on Canagliflozin 100mg  daily. Dr. would like that medication started on patient. Spoke with pharmacy, order to be placed.

## 2019-10-29 LAB — CULTURE, BLOOD 1: Culture: NO GROWTH

## 2019-11-19 ENCOUNTER — Inpatient Hospital Stay: Admit: 2019-11-19 | Discharge: 2019-11-19 | Disposition: A | Discharge status: Home / Self Care | Payer: MEDICARE

## 2019-11-19 DIAGNOSIS — J069 Acute upper respiratory infection, unspecified: Secondary | ICD-10-CM

## 2019-11-19 NOTE — Discharge Instructions (Signed)
You have a viral nasal/sinus infection causing congestion and/or facial pressure.  This is not caused by a bacteria therefore antibiotics are not indicated and won't help with your symptoms.  Viral illnesses/symptoms typically can last for 10-14 days.    Home self care for your Viral Nasal/Sinus congestion:    *  Rest, Hydration (6-10 glasses/day)  * Steamy shower  * Apply warm facial packs  * Nasal saline irrigation     (inquire with Pharmacy employee about over-the-counter Neti Pot)  * Sleep w/ head elevated  * Avoid cigarette smoke   * Use of humidifier/vaporizer  * Analgesics/pain relievers like Ibuprofen (brand name: Advil/Motrin)     and Antipyretics/fever-reducers like Acetaminophin (brand name:     Tylenol) as needed every 6-8 hrs.       Oral decongestant (Pseudophed) is available over the counter, but you will need to request this and provide ID.    Intranasal sprays are used to decrease mucosal edema (ex. Oxymetazoline or Phenylephrine), DO NOT USE MORE THAN 4 DAYS duration to prevent rebound congestion (return of congestion worse than initial symptoms).    Sinusitis: Care Instructions  Your Care Instructions     Sinusitis is an infection of the lining of the sinus cavities in your head. Sinusitis often follows a cold. It causes pain and pressure in your head and face.  In most cases, sinusitis gets better on its own in 1 to 2 weeks. But some mild symptoms may last for several weeks. Sometimes antibiotics are needed.  Follow-up care is a key part of your treatment and safety. Be sure to make and go to all appointments, and call your doctor if you are having problems. It's also a good idea to know your test results and keep a list of the medicines you take.  How can you care for yourself at home?  Take an over-the-counter pain medicine, such as acetaminophen (Tylenol), ibuprofen (Advil, Motrin), or naproxen (Aleve). Read and follow all instructions on the label.  If the doctor prescribed antibiotics, take  them as directed. Do not stop taking them just because you feel better. You need to take the full course of antibiotics.  Be careful when taking over-the-counter cold or flu medicines and Tylenol at the same time. Many of these medicines have acetaminophen, which is Tylenol. Read the labels to make sure that you are not taking more than the recommended dose. Too much acetaminophen (Tylenol) can be harmful.  Breathe warm, moist air from a steamy shower, a hot bath, or a sink filled with hot water. Avoid cold, dry air. Using a humidifier in your home may help. Follow the directions for cleaning the machine.  Use saline (saltwater) nasal washes to help keep your nasal passages open and wash out mucus and bacteria. You can buy saline nose drops at a grocery store or drugstore. Or you can make your own at home by adding 1 teaspoon of salt and 1 teaspoon of baking soda to 2 cups of distilled water. If you make your own, fill a bulb syringe with the solution, insert the tip into your nostril, and squeeze gently. Blow your nose.  Put a hot, wet towel or a warm gel pack on your face 3 or 4 times a day for 5 to 10 minutes each time.  Try a decongestant nasal spray like oxymetazoline (Afrin). Do not use it for more than 3 days in a row. Using it for more than 3 days can make your congestion worse.    When should you call for help?  Call your doctor now or seek immediate medical care if:  You have new or worse swelling or redness in your face or around your eyes.  You have a new or higher fever.  Watch closely for changes in your health, and be sure to contact your doctor if:  You have new or worse facial pain.  The mucus from your nose becomes thicker (like pus) or has new blood in it.  You are not getting better as expected.   Where can you learn more?   Go to [email or web address] and sign in to your MyChart account. Enter [phone] in the Search Health Information box to learn more about "Sinusitis: Care  Instructions."    If you do not have an account, please click on the "Sign Up Now" link.      2006-2015 [employer]. Care instructions adapted under license by [facility]. This care instruction is for use with your licensed healthcare professional. If you have questions about a medical condition or this instruction, always ask your healthcare professional. [employer] disclaims any warranty or liability for your use of this information.  Content Version: 10.6.465758; Current as of: December 09, 2012      Saline Nasal Washes: Care Instructions  Your Care Instructions  Saline nasal washes help keep the nasal passages open by washing out thick or dried mucus. This simple remedy can help relieve symptoms of allergies, sinusitis, and colds. It also can make the nose feel more comfortable by keeping the mucous membranes moist. You may notice a little burning sensation in your nose the first few times you use the solution, but this usually gets better in a few days.  Follow-up care is a key part of your treatment and safety. Be sure to make and go to all appointments, and call your doctor if you are having problems. It's also a good idea to know your test results and keep a list of the medicines you take.  How can you care for yourself at home?  You can buy premixed saline solution in a squeeze bottle or other sinus rinse products at a drugstore. Read and follow the instructions on the label.  You also can make your own saline solution by adding 1 teaspoon of salt and 1 teaspoon of baking soda to 2 cups of distilled water.  If you use a homemade solution, pour a small amount into a clean bowl. Using a rubber bulb syringe, squeeze the syringe and place the tip in the salt water. Pull a small amount of the salt water into the syringe by relaxing your hand.  Sit down with your head tilted slightly back. Do not lie down. Put the tip of the bulb syringe or the squeeze bottle a little way into one of  your nostrils. Gently squirt a small amount (about 1 teaspoon) into the nostril. Repeat with the other nostril. Some sneezing and gagging are normal at first.  Gently blow your nose.  Wipe the syringe or bottle tip clean after each use.  Repeat this 2 or 3 times a day.  Use nasal washes gently if you have nosebleeds often.  When should you call for help?  Watch closely for changes in your health, and be sure to contact your doctor if:  You often get nosebleeds.  You have problems doing the nasal washes.   Where can you learn more?   Go to [email or web address] and sign in to your MyChart account. Enter [phone]  in the Search Health Information box to learn more about "Saline Nasal Washes: Care Instructions."    If you do not have an account, please click on the "Sign Up Now" link.      2006-2015 [employer]. Care instructions adapted under license by [facility]. This care instruction is for use with your licensed healthcare professional. If you have questions about a medical condition or this instruction, always ask your healthcare professional. [employer] disclaims any warranty or liability for your use of this information.  Content Version: 10.6.465758; Current as of: December 09, 2012

## 2019-11-19 NOTE — ED Provider Notes (Signed)
[facility]  [phone]  [facility] [place] [address]  Phone: [phone]  Fax: [phone]      eMERGENCY dEPARTMENT eNCOUnter      Pt Name: Amy Arellano  MRN: [id number]  Birthdate Oct 05, 1945  Date of evaluation: 11/19/19      CHIEF COMPLAINT:  Chief Complaint   Patient presents with   ??? Sinusitis   ??? Otalgia       HISTORY OF PRESENT ILLNESS    Maiko Salais is a 74 y.o. female who presents with upper respiratory complaints:    Location/Symptom:   Fevers?    NO  Nasal congestion?  YES  Sorethroat?  YES   Ear pain or pulling?  YES   Cough?    NO  Wheezing?   NO  Neck pain?   NO  Nausea?  NO  Significant fatigue symptoms?   NO  Myalgias/arthralgia?   NO    Timing/Onset:   4 days  Context/Setting:    Pt here with persistent sinus/nasal congestion and drainage.  Very mild ST, worse at night and in the mornings.  No cough.  Some ear pain and fullness but no otorrhea.  No HA/neck pain.  Taking antihistamine at home that seems to be helping she confirms, hasn't taken anything else.   No cough/SOB.     Duration: Constant  Modifying Factors:   As above  Severity: Mild-to-moderate    Nursing Notes were reviewed.  REVIEW OF SYSTEMS       Constitutional: per HPI  HENT:   Per HPI  Neck:  Per HPI  Respiratory:  Per HPI  Cardiac:  Denies recent chest pain.    GI:  Per HPI  GU: Denies dysuria.    Musculoskeletal: Full ROM.   Neurologic: Denies headache or focal weakness.    Skin:  Denies any rash.      Negative in 10 essential Systems except as mentioned above and in the HPI.      PAST MEDICAL HISTORY   PMH:  has a past medical history of Cancer (HCC) and Diabetes mellitus (HCC).  Surgical History:  has no past surgical history on file.  Social History:  reports that she has never smoked. She has never used smokeless tobacco. She reports previous alcohol use. She reports that she does not use drugs.  Family History: None  Psychiatric History: None    Allergies:is allergic to oxycodone and rocephin  [ceftriaxone].      PHYSICAL EXAM     INITIAL VITALS: BP (!) 178/102    Pulse 105    Temp 98 ??F (36.7 ??C) (Oral)    Resp 18    Ht 4\' 10"  (1.473 m)    Wt 62.5 kg (137 lb 12.6 oz)    SpO2 98%    BMI 28.80 kg/m??   Constitutional:  Well developed   Eyes:  Pupils equal/round, normal conjunctiva bilat  HENT:  Posterior pharynx nonedematous/nonerythematous.  External ears normal, bilat TM wnl.  No sinus tenderness or edema.  Nose normal, has bilat mucosal erythema/irritation, mild congestion.  Neck- supple, no anterior or posterior cervical lymphadenopathy.   Respiratory:  Clear to auscultation bilaterally with good air exchange, no W/R/R  Cardiovascular:  RRR with normal S1 and S2  Musculoskeletal:  Normal to inspection  Back:  No CVA tenderness.    Integument:   No rash.    Neurologic:  Alert, appropriate, no focal deficits noted       DIAGNOSTIC RESULTS  EKG: All EKG's are interpreted by the Emergency Department Physician who either signs or Co-signs this chart in the absence of a cardiologist.  Not indicated    RADIOLOGY:   Reviewed the radiologist:  No orders to display           LABS:  Labs Reviewed - No data to display  Not indicated    EMERGENCY DEPARTMENT COURSE:     1647  Viral URI x 4 day, no signs of bacterial infection at this time.  Recommended OTC decongestant and/or nasal sprays.  Pt agreeable to plan.  Discussed symptoms worrisome for bacterial sinus infections.     No orders of the defined types were placed in this encounter.      CONSULTS:  None      FINAL IMPRESSION      1. Viral URI          DISPOSITION/PLAN:  DISPOSITION Decision To Discharge 11/19/2019 01:45:00 PM        PATIENT REFERRED TO:  Adalberto Cole, MD  [address]  [place] [address]  [phone]    Schedule an appointment as soon as possible for a visit in 3 days  for re-evaluation of your symptoms    [facility]  [phone]  [place] [address]  [phone]  Go to   for worsening of symptoms, fevers,  severe facial pain/swelling or painful lymph nodes      DISCHARGE MEDICATIONS:  New Prescriptions    No medications on file       (Please note that portions of this note were completed with a voice recognition program.  Efforts were made to edit the dictations but occasionally words are mis-transcribed.)    Hilliard Clark, PA-C      Hilliard Clark, PA-C  11/19/19 1351

## 2019-11-19 NOTE — Progress Notes (Signed)
Patient arrived to ED room 8 with complaints of sinus issues and earache.  Patient denies any fevers.  Patient respirations easy and unlabored.  Patient instructed on plan of care.

## 2019-11-19 NOTE — ED Provider Notes (Signed)
[place] [facility]  eMERGENCY dEPARTMENT eNCOUnter   Independent Attestation     Pt Name: Amy Arellano  MRN: [id number]  Birthdate [birth year]  Date of evaluation: 11/19/19       Amy Arellano is a 74 y.o. female who presents with Sinusitis and Otalgia        Based on the medical record, the care appears appropriate. I was personally available for consultation in the Emergency Department.    Manfred Shirts, MD  Attending Emergency  Physician               Manfred Shirts, MD  11/19/19 [phone]

## 2021-10-20 NOTE — Progress Notes (Signed)
Formatting of this note is different from the original.  PATIENT NAME: Amy Arellano    MRN: [id number]  DATE: 10/20/2021    PRIMARY CARE PHYSICIAN: Dr. Nanine Means Paat  OTHER PHYSICIANS: Dr. Penelope Galas, Dr. Colman Cater, Dr. Kem Parkinson    Portions of this encounter note have been copied from my note from 04/21/2021 and has been updated where appropriate, and reflect my current medical decision making from today.    CC:  This is a 76 year old female with a history of intrahepatic cholangiocarcinoma, seen for scheduled follow-up.    INTERIM HISTORY:  Since the patient's last visit here she has had no significant medical changes.  Currently she feels well with no particular complaints.  She denies any abdominal pain, nausea, or other GI symptoms.    MEDICATIONS:   pioglitazone (ACTOS) 15 mg tablet     magnesium oxide (MAG-OX) 400 mg (241.3 mg magnesium) tablet Take 1 tablet by mouth twice daily.    DULoxetine (CYMBALTA) 60 mg capsule Take 60 mg by mouth once daily.    amLODIPine (NORVASC) 5 mg tablet     spironolactone (ALDACTONE) 50 mg tablet Take 1 tablet by mouth every other day.    iv contrast (will be provided with radiology test) CT Chest ABD/PEL-Inject, intravenously, once for 1 dose.No IV access, insert saline lock prior to the beginning of sedation, infusion, injection of imaging exam. Discontinue saline lock post exam. If Pt. has a central line or IVAD, may access for administration according to line specific nursing protocol. Once exam is complete flush line and de-access according to line specific nursing protocol in the CT contrast administration guidelines link. (Patient not taking: Reported on 04/21/2021)    omeprazole (PRILOSEC) 20 mg capsule Take 1 capsule by mouth once daily.    furosemide (LASIX) 20 mg tablet Take 1 tab by mouth once every other day. Alternating with spironolactone    glimepiride (AMARYL) 2 mg tablet TAKE 1 TAB DAILY WITH FIRST MEAL. INCREASE TO 2 TABS AFTER 7 DAYS IF SUGAR IS OVER 150.     cholecalciferol (VITAMIN D3) 50 mcg (2,000 unit) tablet Take 2,000 Units by mouth once daily.    metFORMIN (GLUCOPHAGE) 1,000 mg tablet Take 1,000 mg by mouth twice daily with meals.    ALPRAZolam (XANAX) 0.25 mg tablet Take 0.25 mg by mouth as needed.    (Patient not taking: Reported on 04/21/2021)     ALLERGIES:   Oxycodone and Ceftriaxone    PAST MEDICAL HISTORY:    PAST MEDICAL HISTORY   Diagnosis Date    Anxiety     Bladder cancer ([place])     Depression     DM (diabetes mellitus) ([place])     type 2    HTN (hypertension)     Intrahepatic cholangiocarcinoma ([place])     [id number]    Mitral valve regurgitation 1990     PAST SURGICAL HISTORY:    PAST SURGICAL HISTORY   Procedure Laterality Date    BLADDER BIOSPY FULGUR      CARPAL TUNNEL Bilateral     CHOLECYSTECTOMY HX      COLONOSCOPY  04/29/2016    Straub- diverticulosis, sessile serrated polyps, repeat in 5 years    EGD  04/29/2016    Straub- small hiatal hernia, gastritis    LAP LIVER RESECTION       REVIEW OF SYSTEMS:  General: No weight loss, malaise or fevers.  HEENT: Negative for frequent or significant headaches. No changes in hearing  or vision, no nose bleeds or other nasal problems.  Respiratory: Negative for cough, wheezing or shortness of breath.  Cardiovascular: Negative for chest pain, leg swelling or palpitations.  GI: Negative for abdominal discomfort, blood in stools or black stools or change in bowel habits.  GU: No history of dysuria, frequency or incontinence.  Musculoskeletal: Negative for joint pain or swelling, back pain and muscle pain.  Skin: Negative for lesions, rash and itching.  Hematology/Lymphology: Negative for prolonged bleeding, bruising easily or swollen nodes.  Neuro: No history of headaches, syncope, paralysis, seizures or tremors.    PHYSICAL EXAM:   Vitals: BP 144/59   Pulse 68   Temp 36.5 C (97.7 F) (Temporal)   Resp 18   Ht 148.3 cm (4' 10.39")   Wt 64 kg (141 lb 3.2 oz)   LMP  (LMP Unknown)   SpO2 98%   BMI 29.12 kg/m    ECOG 0    Exam limited to gross visualization where appropriate due to COVID-19.  Gen.: This is an age-appropriate patient in no acute distress.   Head: Appears atraumatic with no visible lesions.  Eyes: Pupils equally round and reactive to light, extraocular muscles are intact.  Neck: Supple.  Mouth: Mucous membranes appeared to be moist.  Respiratory: Appears to be respiring comfortably.  Neurologic: Nonfocal to gross visualization.  Alert and oriented 3.  Psychiatric: No evidence of inappropriate anxiety or depression.  Skin: Visible areas of skin without rash, lesions, wounds or petechiae.    PATHOLOGY:  05/01/2016  LIVER RESECTION  FINAL DIAGNOSIS   1. Lymph node, excision (A)  One lymph node, negative for neoplasm (0/1).   2. Liver nodule, excision (B)  Negative for neoplasm.   3. Gallbladder, cholecystectomy (C) - Cholelithiasis.   4. Liver, segments 5/6, excision (D)  Invasive moderately differentiated   adenocarcinoma (cholangiocarcinoma). See synoptic report and comment.   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   - Incidental biliary hamartoma.   - Margins of resection are negative for neoplasm.     02/18/2016  TURBT  FINAL DIAGNOSIS   1. Urinary bladder, superficial tumor, transurethral resection (A) -   Noninvasive high-grade papillary urothelial carcinoma.   - Muscularis propria (detrusor muscle) is not present for evaluation.   2. Urinary bladder, deep bladder tumor, transurethral resection (B) -   Noninvasive high-grade papillary urothelial carcinoma.   - Small focus of benign endosalpingiosus.   - Muscularis propria (detrusor muscle) is present and is negative for   tumor.       LABORATORY DATA:  Hemoglobin (g/dL)   Date Value   16/10/9602 12.0   03/08/2020 12.7     Hematocrit (%)   Date Value   10/13/2021 37.9   03/08/2020 38.4     WBC (k/uL)   Date Value   10/13/2021 6.52   03/08/2020 7.67     Platelet Count (k/uL)   Date Value   10/13/2021 214   03/08/2020 230     RADIOLOGY/OTHER  STUDIES:   10/13/2021 CT abdomen/pelvis  IMPRESSION:   1.  Subcentimeter hepatic hypodensities, larger measuring 8 mm, stable   from the prior examination of 04/14/2021.   2.  Trace perihepatic ascites, subcentimeter peritoneal nodularity,   unchanged.   3.  1.4 x 1.1 cm soft tissue density, nodular lesion within the left   inguinal region, improved in size, as above.     04/14/2021 CT chest  IMPRESSION:   1.  Mild soft tissue prominence within  the AP window region of the   mediastinum, stable.   2.  Several bilateral 4 mm or less pulmonary nodules, unchanged.     04/14/2021 CT abdomen/pelvis  IMPRESSION:   1.  Postoperative changes involving the right lobe of the liver are again   appreciated.  0.8 cm right lobe hypodensity is not clearly appreciated on   the prior study of 07/25/2020.  Correlation with continued follow-up   examinations is recommended.   2.  Soft tissue density within the left inguinal canal, slightly   progressed in size.   3.  Numerous small upper mesenteric and retroperitoneal lymph nodes are   again identified, unchanged.   4.  Subcentimeter peritoneal nodules ventral to the stomach, stable.   5.  Ventral, epigastric hernia containing a portion of nonobstructed   transverse colon, stable.     10/09/2020 PET scan  IMPRESSION:   1.  NECK: No FDG avid neoplastic process. No mass, adenopathy, or fluid   collection.   2.  CHEST: No FDG avid neoplastic process.  No mass, adenopathy, or fluid   collection.   3.  ABDOMEN/PELVIS: No FDG avid neoplastic process.  No mass, adenopathy,   or fluid collection.   4.  EXTREMITIES/SKELETON:  No FDG avid osseous process. No   destructive/traumatic bony abnormality.     08/19/2020 MRI liver  IMPRESSION:   Fatty, cirrhotic liver with mild splenomegaly.   Right inferior hepatic lobe wedge resection without evidence of   recurrence.   No OPTN class 5 lesion/HCC.  Multiple small hypervascular foci, likely   shunts.  Continued surveillance recommended.     07/25/2020 CT  chest  IMPRESSION:   1.  Stable appearance of bilateral pulmonary nodules.  No new or   enlarging nodules are noted.   2.  Stable AP window node measuring 1 cm in short axis.     07/25/2020 CT abdomen/pelvis  IMPRESSION:   1.  Postsurgical changes within the liver.  No definite findings of local   recurrence.  However, there is increasing heterogeneity of the liver with   questionable new or enlarging hypodense lesions within the liver.     Metastatic disease is not excluded.  Consider further evaluation with   contrast-enhanced MRI.   2.  Interval increase in soft tissue density within the left inguinal   canal, suspicious for metastatic disease.   3.  Stable appearance of soft tissue nodules anterior to the stomach.   4.  Stable appearance of epigastric hernia containing a portion of the   transverse colon.     03/08/2020 CT chest  IMPRESSION:   1.  No enlarging suspicious pulmonary nodules.  No progressive   lymphadenopathy.   2.  Scattered subcentimeter pulmonary nodules measuring up to 0.5 cm are   unchanged since 09/21/2019.   3.  Partially imaged epigastric anterior abdominal wall hernia containing   a nonobstructed portion of the colon.  This extends anterior to the   xiphoid process and may represent the queried "sternal mass".   4.  Please refer to concurrently acquired and separately reported abdomen   CT for findings related to the upper abdomen.     03/08/2020 CT abdomen/pelvis  IMPRESSION:   1.  No significant change since 09/21/2019.  Stable postoperative   appearance involving the liver.   2.  Unchanged nonspecific subcentimeter peritoneal nodules anterior to   the gastric body.   3.  No progressive lymphadenopathy.   4.  Unchanged splenomegaly.  5.  Please refer to concurrently acquired and separately reported chest   CT for findings related to the thorax.     09/21/2019 CT chest  IMPRESSION:  1.  Bilateral subcentimeter pulmonary nodules, stable from prior study of   03/07/2019.  2.  Mildly prominent  mediastinal lymph node within the AP window region,   stable.    09/21/2019 CT abdomen/pelvis  IMPRESSION:  1.  Postoperative changes involving the liver, stable in appearance.  2.  Small mesenteric and retroperitoneal lymph nodes are again noted,   unchanged.  Subcentimeter peritoneal nodules, perhaps related to   prominent vasculature, unchanged.  3.  Small volume abdominal and pelvic ascites, slightly progressed from   the prior study.    ASSESSMENT/PLAN:  1. Intrahepatic cholangiocarcinoma (HCC) - ICD9: 155.1, ICD10: C22.1 (primary diagnosis)  Stage 1 (pT1A, N0, M0) intrahepatic cholangiocarcinoma diagnosed February 2018 (liver biopsy 03/19/2016)  Status post complete resection 05/01/2016.  Postop plans were to give adjuvant chemotherapy with Xeloda 6 months.  Xeloda was started May 2018, but stopped after 2 weeks due to side effects (liver dysfunction with ascites).  She has had no evidence of recurrent disease since.  However, labs obtained 07/08/2020 revealed an increasing tumor marker.  CT scans obtained 07/25/2020 revealed subtle abnormalities in the liver as well as a suspicious left inguinal lymph node.  MRI liver 08/19/2020 showed no areas consistent with malignancy.  A subsequent PET scan 10/09/2020 also was negative.  Follow-up CA 19-9 levels have decreased.  Most recent staging CT scans 10/13/2021 improved.  Currently the patient is clinically stable with no evidence of disease.      At this time we will continue routine surveillance.  We will restage with CT scans at 6 months (March 2024), the patient will then return for follow-up.      2.  History of non-muscle invasive bladder cancer  (HCC) - ICD9: 188.8, ICD10: C67.8  Diagnosed January 2018 after presenting with hematuria.  Status post TURBT 02/18/2016.  Status post intravesicular BCG, last given September 2018.  Currently no evidence of disease.  Continue management per urology.      3. Liver cirrhosis secondary to NASH [facility]) - ICD9: 571.8, 571.5, ICD10:  K75.81, K74.60  Diagnosed April 2018 per liver biopsy.  Currently minimally symptomatic.  Continue management per GI.    4. Type 2 diabetes mellitus (HCC) - ICD9: 250.00, ICD10: E11.9  Continue management per PCP.    5. Essential hypertension - ICD9: 401.9, ICD10: I10  Continue management per PCP.    6.  Anemia  CBC obtained 09/02/2018 revealed mild microcytic anemia consistent with iron deficiency. OTC iron 1 daily started Aug 2020.  The patient's CBC normalized, and oral iron was discontinued June 2022.  We will monitor her iron stores and resume iron if evidence of recurrent iron deficiency.  Will also monitor for evidence of occult GI bleeding.    Charlyne Petrin, MD      Electronically signed by Charlyne Petrin, MD at 10/20/2021  8:06 PM EDT

## 2021-11-28 ENCOUNTER — Encounter

## 2021-11-28 MED ORDER — ALPRAZOLAM 0.25 MG PO TABS
0.25 MG | ORAL_TABLET | Freq: Every day | ORAL | 0 refills | Status: DC | PRN
Start: 2021-11-28 — End: 2022-04-29

## 2021-12-12 ENCOUNTER — Encounter: Payer: MEDICARE | Attending: Physician Assistant | Primary: Internal Medicine

## 2021-12-23 LAB — CBC WITH AUTO DIFFERENTIAL
Absolute Immature Granulocyte: <0.03 10*3/uL (ref 0.00–0.30)
Basophils %: 1 % (ref 0–2)
Basophils Absolute: 0.06 10*3/uL (ref 0.00–0.20)
Eosinophils %: 2 % (ref 1–4)
Eosinophils Absolute: 0.14 10*3/uL (ref 0.00–0.44)
Hematocrit: 37.9 % (ref 36.3–47.1)
Hemoglobin: 11.8 g/dL — ABNORMAL LOW (ref 11.9–15.1)
Immature Granulocytes: 0 %
Lymphocytes %: 27 % (ref 24–43)
Lymphocytes Absolute: 1.85 10*3/uL (ref 1.10–3.70)
MCH: 28.2 pg (ref 25.2–33.5)
MCHC: 31.1 g/dL (ref 28.4–34.8)
MCV: 90.5 fL (ref 82.6–102.9)
MPV: 10 fL (ref 8.1–13.5)
Monocytes %: 7 % (ref 3–12)
Monocytes Absolute: 0.49 10*3/uL (ref 0.10–1.20)
NRBC Automated: 0 per 100 WBC
Neutrophils %: 63 % (ref 36–65)
Neutrophils Absolute: 4.28 10*3/uL (ref 1.50–8.10)
Platelets: 260 10*3/uL (ref 138–453)
RBC: 4.19 m/uL (ref 3.95–5.11)
RDW: 13.7 % (ref 11.8–14.4)
WBC: 6.8 10*3/uL (ref 3.5–11.3)

## 2021-12-23 LAB — COMPREHENSIVE METABOLIC PANEL
ALT: 22 U/L (ref 10–35)
AST: 29 U/L (ref 10–35)
Albumin/Globulin Ratio: 1 (ref 1.0–2.5)
Albumin: 4.6 g/dL (ref 3.5–5.2)
Alkaline Phosphatase: 92 U/L (ref 35–104)
Anion Gap: 12 mmol/L (ref 9–16)
BUN: 26 mg/dL — ABNORMAL HIGH (ref 8–23)
CO2: 26 mmol/L (ref 20–31)
Calcium: 9.8 mg/dL (ref 8.6–10.4)
Chloride: 103 mmol/L (ref 98–107)
Creatinine: 0.9 mg/dL (ref 0.50–0.90)
Est, Glom Filt Rate: >60 mL/min/{1.73_m2} (ref >60)
Glucose: 103 mg/dL — ABNORMAL HIGH (ref 74–99)
Potassium: 4.3 mmol/L (ref 3.7–5.3)
Sodium: 141 mmol/L (ref 136–145)
Total Bilirubin: 0.3 mg/dL (ref 0.00–1.20)
Total Protein: 7.7 g/dL (ref 6.6–8.7)

## 2021-12-23 LAB — HEMOGLOBIN A1C
Estimated Avg Glucose: 126 mg/dL
Hemoglobin A1C: 6 % (ref 4.0–6.0)

## 2021-12-23 LAB — LIPID PANEL
Chol/HDL Ratio: 3
Cholesterol: 165 mg/dL (ref 0–199)
HDL: 51 mg/dL (ref >40)
LDL Cholesterol: 93 mg/dL (ref 0–100)
Triglycerides: 107 mg/dL (ref 0–149)
VLDL: 21 mg/dL

## 2021-12-23 LAB — MAGNESIUM: Magnesium: 1.9 mg/dL (ref 1.6–2.4)

## 2021-12-23 LAB — MICROALBUMIN, UR
Creatinine, Ur: 58.3 mg/dL (ref 28.0–217.0)
Microalb, Ur: 31 mg/L — ABNORMAL HIGH (ref <21)
Microalb/Crt. Ratio: 53 mcg/mg creat — ABNORMAL HIGH (ref <25)

## 2021-12-23 LAB — VITAMIN D 25 HYDROXY: Vit D, 25-Hydroxy: 54.6 ng/mL (ref 30.0–100.0)

## 2021-12-25 ENCOUNTER — Encounter
Admit: 2021-12-25 | Discharge: 2021-12-25 | Payer: MEDICARE | Attending: Physician Assistant | Primary: Internal Medicine

## 2021-12-25 DIAGNOSIS — E119 Type 2 diabetes mellitus without complications: Secondary | ICD-10-CM

## 2021-12-25 MED ORDER — DULOXETINE HCL 60 MG PO CPEP
60 MG | ORAL_CAPSULE | Freq: Every day | ORAL | 3 refills | Status: AC
Start: 2021-12-25 — End: 2022-11-25

## 2021-12-25 NOTE — Progress Notes (Signed)
[facility]     Date of Visit:  12/25/2021  Patient Name: Amy Arellano   Patient DOB:  February 15, 1945     CHIEF COMPLAINT:     [name] is a 76 y.o. female who presents today for an general visit to be evaluated for the following condition(s):  Chief Complaint   Patient presents with    6 Month Follow-Up       HISTORY OF PRESENT ILLNESS      Patient has been doing well.  She is sticking to her diabetic diet and using her Dexcom and blood sugars have been very well-controlled usually 120 or less.  She has been having some low blood pressure readings under 60.  She has been taking the glimepiride 2 mg in the morning only for the past 6 months and still having low blood sugars.  No chest pains.  No shortness of breath.  No CAD or CVA symptoms.  No falls.  She sees her oncologist every 6 months.  No recurrence of cancer.    REVIEW OF SYSTEMS     Review of Systems   Constitutional:  Negative for chills, fever and unexpected weight change.   HENT:  Negative for congestion, ear pain, hearing loss, rhinorrhea, sinus pressure, sinus pain, sneezing and sore throat.    Eyes:  Negative for pain, redness and visual disturbance.   Respiratory:  Negative for cough, shortness of breath and wheezing.    Cardiovascular:  Negative for chest pain, palpitations and leg swelling.   Gastrointestinal:  Negative for abdominal pain, blood in stool, constipation, diarrhea, nausea and vomiting.   Endocrine: Negative for cold intolerance and heat intolerance.   Genitourinary:  Negative for difficulty urinating, dysuria, frequency, hematuria and urgency.   Musculoskeletal:  Negative for arthralgias, back pain, gait problem, joint swelling, myalgias and neck pain.   Skin:  Negative for rash and wound.   Allergic/Immunologic: Negative for immunocompromised state.   Neurological:  Negative for dizziness, tremors, seizures, syncope, speech difficulty, weakness, light-headedness, numbness and headaches.   Psychiatric/Behavioral:  Negative  for confusion, hallucinations and sleep disturbance. The patient is not nervous/anxious.         REVIEWED INFORMATION      Current Outpatient Medications   Medication Sig Dispense Refill    magnesium Oxide 500 MG TABS Take 1.5 tablets by mouth daily      atorvastatin (LIPITOR) 80 MG tablet Take 1 tablet by mouth daily      pioglitazone (ACTOS) 15 MG tablet Take 1 tablet by mouth daily      omeprazole (PRILOSEC) 20 MG delayed release capsule Take 1 capsule by mouth daily      metFORMIN (GLUCOPHAGE) 1000 MG tablet Take 1 tablet by mouth 2 times daily (with meals)      amLODIPine (NORVASC) 5 MG tablet Take 1 tablet by mouth daily      DULoxetine (CYMBALTA) 60 MG extended release capsule Take 1 capsule by mouth daily 90 capsule 3    furosemide (LASIX) 20 MG tablet Take 1 tablet by mouth every other day      glimepiride (AMARYL) 2 MG tablet Take 1 tablet by mouth daily      spironolactone (ALDACTONE) 50 MG tablet Take 1 tablet by mouth every other day      Cholecalciferol 50 MCG (2000 UT) TABS Take 2,000 Units by mouth daily      ALPRAZolam (XANAX) 0.25 MG tablet Take 1 tablet by mouth daily as needed for  Anxiety for up to 30 days. Max Daily Amount: 0.25 mg 30 tablet 0     No current facility-administered medications for this visit.        Allergies   Allergen Reactions    Oxycodone Itching    Rocephin [Ceftriaxone] Rash     Developed a diffuse, widespread maculopapular rash 1 day after starting ceftriaxone with minimal urticaria/edema.       Patient Active Problem List   Diagnosis    Hypomagnesemia    Type 2 diabetes mellitus without complication, without long-term current use of insulin (HCC)    Intrahepatic cholangiocarcinoma (HCC)    Bladder cancer (HCC)    Hypokalemia    Mixed hyperlipidemia    Gastroesophageal reflux disease without esophagitis    Essential hypertension       Past Medical History:   Diagnosis Date    Cancer (HCC)     Diabetes mellitus (HCC)        Past Surgical History:   Procedure Laterality Date     CARPAL TUNNEL RELEASE Bilateral     CHOLECYSTECTOMY  04/2016    [facility]    COLONOSCOPY  04/2016    [facility]    LIVER RESECTION  04/2016    [facility]    THORACENTESIS  04/2016    TRANSURETHRAL RESECTION OF BLADDER TUMOR  01/2016    [facility]        Social History     Socioeconomic History    Marital status: Married     Spouse name: None    Number of children: None    Years of education: None    Highest education level: None   Tobacco Use    Smoking status: Former     Packs/day: 1.00     Years: 26.00     Additional pack years: 0.00     Total pack years: 26.00     Types: Cigarettes     Start date: 01/27/1967     Quit date: 01/26/1993     Years since quitting: 28.9    Smokeless tobacco: Never   Vaping Use    Vaping Use: Never used   Substance and Sexual Activity    Alcohol use: Not Currently    Drug use: Never     Social Determinants of Health     Financial Resource Strain: Low Risk  (12/25/2021)    Overall Financial Resource Strain (CARDIA)     Difficulty of Paying Living Expenses: Not hard at all   Food Insecurity: No Food Insecurity (12/25/2021)    Hunger Vital Sign     Worried About Running Out of Food in the Last Year: Never true     Ran Out of Food in the Last Year: Never true   Transportation Needs: Unknown (12/25/2021)    PRAPARE - Transportation     Lack of Transportation (Non-Medical): No   Housing Stability: Unknown (12/25/2021)    Housing Stability Vital Sign     Unstable Housing in the Last Year: No        Family History   Problem Relation Age of Onset    Stroke Mother 75    Heart Attack Mother     Melanoma Father     Breast Cancer Sister     Breast Cancer Sister         PHYSICAL EXAM     BP 122/70 (Site: Left Upper Arm, Position: Sitting, Cuff Size: Medium Adult)   Pulse 80   Temp  98.1 F (36.7 C) (Temporal)   Ht 1.499 m (4\' 11" )   Wt 63.1 kg (139 lb 3.2 oz)   SpO2 99%   BMI 28.11 kg/m    Physical Exam  Vitals reviewed.   Constitutional:       General: She  is not in acute distress.     Appearance: Normal appearance.   HENT:      Head: Normocephalic and atraumatic.      Right Ear: Tympanic membrane and external ear normal.      Left Ear: Tympanic membrane and external ear normal.      Nose: Nose normal.      Mouth/Throat:      Mouth: Mucous membranes are moist.      Pharynx: No oropharyngeal exudate or posterior oropharyngeal erythema.   Eyes:      Extraocular Movements: Extraocular movements intact.      Conjunctiva/sclera: Conjunctivae normal.      Pupils: Pupils are equal, round, and reactive to light.   Neck:      Vascular: No carotid bruit.   Cardiovascular:      Rate and Rhythm: Normal rate and regular rhythm.      Pulses: Normal pulses.      Heart sounds: No murmur heard.     No friction rub. No gallop.   Pulmonary:      Effort: Pulmonary effort is normal. No respiratory distress.      Breath sounds: Normal breath sounds. No wheezing, rhonchi or rales.   Abdominal:      General: Bowel sounds are normal. There is no distension.      Palpations: Abdomen is soft. There is no mass.      Tenderness: There is no abdominal tenderness. There is no guarding.   Musculoskeletal:         General: No swelling or deformity. Normal range of motion.      Cervical back: Normal range of motion and neck supple. No rigidity.   Lymphadenopathy:      Cervical: No cervical adenopathy.   Skin:     General: Skin is warm.      Coloration: Skin is not jaundiced or pale.      Findings: No bruising or rash.   Neurological:      General: No focal deficit present.      Mental Status: She is alert and oriented to person, place, and time.      Cranial Nerves: No cranial nerve deficit.      Motor: No weakness.      Gait: Gait normal.      Deep Tendon Reflexes: Reflexes normal.   Psychiatric:         Mood and Affect: Mood normal.         Thought Content: Thought content normal.     married. 3 daughters. 7 grandchildren. smoked 1ppd from 44 to 36 yo.     The 10-year ASCVD risk score (Arnett DK, et  al., 2019) is: 23.6%    Values used to calculate the score:      Age: 3 years      Sex: Female      Is Non-Hispanic African American: No      Diabetic: Yes      Tobacco smoker: No      Systolic Blood Pressure: 962 mmHg      Is BP treated: No      HDL Cholesterol: 51 mg/dL      Total Cholesterol: 165 mg/dL  Results for orders placed or performed during the hospital encounter of 12/23/21   CBC with Auto Differential   Result Value Ref Range    WBC 6.8 3.5 - 11.3 k/uL    RBC 4.19 3.95 - 5.11 m/uL    Hemoglobin 11.8 (L) 11.9 - 15.1 g/dL    Hematocrit 16.1 09.6 - 47.1 %    MCV 90.5 82.6 - 102.9 fL    MCH 28.2 25.2 - 33.5 pg    MCHC 31.1 28.4 - 34.8 g/dL    RDW 04.5 40.9 - 81.1 %    Platelets 260 138 - 453 k/uL    MPV 10.0 8.1 - 13.5 fL    NRBC Automated 0.0 0.0 per 100 WBC    Neutrophils % 63 36 - 65 %    Lymphocytes % 27 24 - 43 %    Monocytes % 7 3 - 12 %    Eosinophils % 2 1 - 4 %    Basophils % 1 0 - 2 %    Immature Granulocytes 0 0 %    Neutrophils Absolute 4.28 1.50 - 8.10 k/uL    Lymphocytes Absolute 1.85 1.10 - 3.70 k/uL    Monocytes Absolute 0.49 0.10 - 1.20 k/uL    Eosinophils Absolute 0.14 0.00 - 0.44 k/uL    Basophils Absolute 0.06 0.00 - 0.20 k/uL    Absolute Immature Granulocyte <0.03 0.00 - 0.30 k/uL   Comprehensive Metabolic Panel   Result Value Ref Range    Sodium 141 136 - 145 mmol/L    Potassium 4.3 3.7 - 5.3 mmol/L    Chloride 103 98 - 107 mmol/L    CO2 26 20 - 31 mmol/L    Anion Gap 12 9 - 16 mmol/L    Glucose 103 (H) 74 - 99 mg/dL    BUN 26 (H) 8 - 23 mg/dL    Creatinine 0.9 9.14 - 0.90 mg/dL    Est, Glom Filt Rate >60 >60 mL/min/1.39m2    Calcium 9.8 8.6 - 10.4 mg/dL    Total Protein 7.7 6.6 - 8.7 g/dL    Albumin 4.6 3.5 - 5.2 g/dL    Albumin/Globulin Ratio 1.0 1.0 - 2.5    Total Bilirubin 0.3 0.00 - 1.20 mg/dL    Alkaline Phosphatase 92 35 - 104 U/L    ALT 22 10 - 35 U/L    AST 29 10 - 35 U/L   Hemoglobin A1C   Result Value Ref Range    Hemoglobin A1C 6.0 4.0 - 6.0 %    Estimated Avg Glucose  126 mg/dL   Lipid Panel   Result Value Ref Range    Cholesterol 165 0 - 199 mg/dL    HDL 51 >78 mg/dL    LDL Cholesterol 93 0 - 100 mg/dL    Chol/HDL Ratio 3.0     Triglycerides 107 0 - 149 mg/dL    VLDL 21 mg/dL   Magnesium   Result Value Ref Range    Magnesium 1.9 1.6 - 2.4 mg/dL   Microalbumin, Ur   Result Value Ref Range    Microalb, Ur 31 (H) <21 mg/L    Creatinine, Ur 58.3 28.0 - 217.0 mg/dL    Microalb/Crt. Ratio 53 (H) <25 mcg/mg creat   Vitamin D 25 Hydroxy   Result Value Ref Range    Vit D, 25-Hydroxy 54.6 30.0 - 100.0 ng/mL      ASSESSMENT/PLAN     1. Type 2 diabetes mellitus without complication, without  long-term current use of insulin (HCC)  -     Hemoglobin A1C; Future  -     Comprehensive Metabolic Panel; Future  Stable and well-controlled.  A1c is much improved.  Has been improving every time she gets checked was 7.06 months ago now 6.0.  [facility] her tremendously also she has a Dexcom which helps.  Continue pioglitazone 15 mg daily, metformin at 1000 mg twice daily however cut the glimepiride 2 mg daily down to one half tab in the a.m. only because of hypoglycemia.  Continue to monitor blood sugars.  Get eyes examined.  2. Moderate episode of recurrent major depressive disorder (HCC)  -     DULoxetine (CYMBALTA) 60 MG extended release capsule; Take 1 capsule by mouth daily, Disp-90 capsule, R-3Normal  Stable and well-controlled on duloxetine 60 mg daily which was refilled.  3. Hypomagnesemia  -     Magnesium; Future  Recent magnesium level in normal range.  Continue magnesium 750 mg daily.  No side effects of diarrhea.  4. Breast cancer screening by mammogram  -     MAM DIGITAL SCREEN W OR WO CAD BILATERAL; Future  5. Intrahepatic cholangiocarcinoma (HCC)  No evidence of recurrence.  Liver tests unremarkable.  [facility] Dr[facility] Eul[facility] PontMurphy  6. Malignant neoplasm of urinary bladder, unspecified site (HCC)  No evidence of  recurrence.  Keep follow-up with oncologist.  7. Mixed hyperlipidemia  Lipids reviewed with patient.  Excellent control and stable on atorvastatin 80 mg daily.  No changes.  8. Gastroesophageal reflux disease without esophagitis  Symptoms controlled with omeprazole 20 mg daily no changes.  9. HTN.  Pressure is stable and controlled on amlodipine 5 mg daily, also alternating between Lasix 20 mg and spironolactone 50 mg.  No changes.  Kidney function and sodium potassium electrolytes all normal.  Return in about 6 months (around 06/25/2022) for f/u DM.    COMMUNICATION:       Electronically signed by Gary FleetLaura L Zaim Nitta, PA-C on 12/25/2021 at 11:58 AM

## 2022-01-30 MED ORDER — PIOGLITAZONE HCL 15 MG PO TABS
15 MG | ORAL_TABLET | Freq: Every day | ORAL | 0 refills | Status: AC
Start: 2022-01-30 — End: 2022-02-11

## 2022-01-30 NOTE — Telephone Encounter (Signed)
Amy Arellano is calling to request a refill on the following medication(s):    Medication Request:  Requested Prescriptions     Pending Prescriptions Disp Refills    pioglitazone (ACTOS) 15 MG tablet [Pharmacy Med Name: Pioglitazone HCl Oral Tablet 15 MG] 90 tablet 0     Sig: TAKE 1 TABLET BY MOUTH EVERY DAY       Last Visit Date (If Applicable):  Visit date not found    Next Visit Date:    Visit date not found

## 2022-02-05 NOTE — Telephone Encounter (Signed)
Amy Arellano is calling to request a refill on the following medication(s):    Last Visit Date (If Applicable):  Visit date not found    Next Visit Date:    Visit date not found    Medication Request:  Requested Prescriptions     Pending Prescriptions Disp Refills    furosemide (LASIX) 20 MG tablet 60 tablet 0     Sig: Take 1 tablet by mouth every other day

## 2022-02-05 NOTE — Telephone Encounter (Signed)
furosemide (LASIX) 20 MG tablet  Take 20 mg by mouth every other day,

## 2022-02-06 MED ORDER — FUROSEMIDE 20 MG PO TABS
20 MG | ORAL_TABLET | ORAL | 0 refills | Status: AC
Start: 2022-02-06 — End: 2022-04-29

## 2022-02-10 NOTE — Telephone Encounter (Signed)
Amy Arellano is calling to request a refill on the following medication(s):    Last Visit Date (If Applicable):  Visit date not found    Next Visit Date:    Visit date not found    Medication Request:  Requested Prescriptions     Pending Prescriptions Disp Refills    pioglitazone (ACTOS) 15 MG tablet [Pharmacy Med Name: Pioglitazone HCl Oral Tablet 15 MG] 90 tablet 0     Sig: TAKE 1 TABLET BY MOUTH EVERY DAY

## 2022-02-11 MED ORDER — PIOGLITAZONE HCL 15 MG PO TABS
15 MG | ORAL_TABLET | Freq: Every day | ORAL | 3 refills | Status: AC
Start: 2022-02-11 — End: 2022-04-29

## 2022-04-29 ENCOUNTER — Ambulatory Visit
Admit: 2022-04-29 | Discharge: 2022-04-29 | Payer: MEDICARE | Attending: Physician Assistant | Primary: Internal Medicine

## 2022-04-29 DIAGNOSIS — F419 Anxiety disorder, unspecified: Secondary | ICD-10-CM

## 2022-04-29 MED ORDER — ALPRAZOLAM 0.25 MG PO TABS
0.25 | ORAL_TABLET | Freq: Every day | ORAL | 0 refills | Status: AC | PRN
Start: 2022-04-29 — End: 2023-07-03

## 2022-04-29 MED ORDER — FUROSEMIDE 20 MG PO TABS
20 MG | ORAL_TABLET | ORAL | 2 refills | Status: DC
Start: 2022-04-29 — End: 2022-11-25

## 2022-04-29 MED ORDER — SPIRONOLACTONE 50 MG PO TABS
50 MG | ORAL_TABLET | ORAL | 3 refills | Status: DC
Start: 2022-04-29 — End: 2023-05-10

## 2022-04-29 MED ORDER — PIOGLITAZONE HCL 15 MG PO TABS
15 MG | ORAL_TABLET | Freq: Every day | ORAL | 3 refills | Status: AC
Start: 2022-04-29 — End: 2022-08-24

## 2022-04-29 NOTE — Progress Notes (Signed)
[facility]     Date of Visit:  04/29/2022  Patient Name: Amy Arellano   Patient DOB:  1946/01/19     CHIEF COMPLAINT:     Amy Arellano is a 77 y.o. female who presents today for an general visit to be evaluated for the following condition(s):  Chief Complaint   Patient presents with    Discuss Medications     Pt. Requesting Xanax        HISTORY OF PRESENT ILLNESS      Patient has been having increased anxiety and depression lately.  Dealing with her husband who had a stroke and then also her father is now in hospice.  Patient is already on Cymbalta 60 mg daily.  She does also have Xanax 0.25 mg that was last refilled in November which she would like a refill on because she uses it as needed.  She does have a Veterinary surgeon at church that she can speak to and from family members and a friend as well.  30-day average blood sugar on her continuous glucose monitoring machine is at 137.  Not really any hypoglycemia episodes.  Taking her medications.  She does need some refills.  No CAD or CVA symptoms.    REVIEW OF SYSTEMS     Review of Systems   Constitutional:  Negative for chills, fever and unexpected weight change.   HENT:  Negative for congestion, ear pain, hearing loss, rhinorrhea, sinus pressure, sinus pain, sneezing and sore throat.    Eyes:  Negative for pain, redness and visual disturbance.   Respiratory:  Negative for cough, shortness of breath and wheezing.    Cardiovascular:  Negative for chest pain, palpitations and leg swelling.   Gastrointestinal:  Negative for abdominal pain, blood in stool, constipation, diarrhea, nausea and vomiting.   Endocrine: Negative for cold intolerance and heat intolerance.   Genitourinary:  Negative for difficulty urinating, dysuria, frequency, hematuria and urgency.   Musculoskeletal:  Negative for arthralgias, back pain, gait problem, joint swelling, myalgias and neck pain.   Skin:  Positive for rash. Negative for wound.   Allergic/Immunologic: Negative for  immunocompromised state.   Neurological:  Negative for dizziness, tremors, seizures, syncope, speech difficulty, weakness, light-headedness, numbness and headaches.   Psychiatric/Behavioral:  Positive for dysphoric mood. Negative for confusion, hallucinations and sleep disturbance. The patient is nervous/anxious.         REVIEWED INFORMATION      Current Outpatient Medications   Medication Sig Dispense Refill    pioglitazone (ACTOS) 15 MG tablet Take 1 tablet by mouth daily 90 tablet 3    spironolactone (ALDACTONE) 50 MG tablet Take 1 tablet by mouth every other day 45 tablet 3    furosemide (LASIX) 20 MG tablet Take 1 tablet by mouth every other day 45 tablet 2    ALPRAZolam (XANAX) 0.25 MG tablet Take 1 tablet by mouth daily as needed for Anxiety for up to 30 days. Max Daily Amount: 0.25 mg 30 tablet 0    magnesium Oxide 500 MG TABS Take 1.5 tablets by mouth daily      omeprazole (PRILOSEC) 20 MG delayed release capsule Take 1 capsule by mouth daily      metFORMIN (GLUCOPHAGE) 1000 MG tablet Take 1 tablet by mouth 2 times daily (with meals)      amLODIPine (NORVASC) 5 MG tablet Take 1 tablet by mouth daily      DULoxetine (CYMBALTA) 60 MG extended release capsule Take 1 capsule by  mouth daily 90 capsule 3    glimepiride (AMARYL) 2 MG tablet Take 0.5 tablets by mouth daily      Cholecalciferol 50 MCG (2000 UT) TABS Take 2,000 Units by mouth daily      atorvastatin (LIPITOR) 80 MG tablet Take 1 tablet by mouth daily       No current facility-administered medications for this visit.        Allergies   Allergen Reactions    Oxycodone Itching    Rocephin [Ceftriaxone] Rash     Developed a diffuse, widespread maculopapular rash 1 day after starting ceftriaxone with minimal urticaria/edema.       Patient Active Problem List   Diagnosis    Hypomagnesemia    Type 2 diabetes mellitus without complication, without long-term current use of insulin (HCC)    Intrahepatic cholangiocarcinoma (HCC)    Bladder cancer (HCC)     Hypokalemia    Mixed hyperlipidemia    Gastroesophageal reflux disease without esophagitis    Essential hypertension    Moderate episode of recurrent major depressive disorder (HCC)    Major depressive disorder, recurrent, moderate    Anxiety       Past Medical History:   Diagnosis Date    Cancer (HCC)     Diabetes mellitus (HCC)        Past Surgical History:   Procedure Laterality Date    CARPAL TUNNEL RELEASE Bilateral     CHOLECYSTECTOMY  04/2016    [facility]    COLONOSCOPY  04/2016    [facility]    LIVER RESECTION  04/2016    [facility]    THORACENTESIS  04/2016    TRANSURETHRAL RESECTION OF BLADDER TUMOR  01/2016    [facility]        Social History     Socioeconomic History    Marital status: Married     Spouse name: None    Number of children: None    Years of education: None    Highest education level: None   Tobacco Use    Smoking status: Former     Current packs/day: 0.00     Average packs/day: 1 pack/day for 26.0 years (26.0 ttl pk-yrs)     Types: Cigarettes     Start date: 01/27/1967     Quit date: 01/26/1993     Years since quitting: 29.2    Smokeless tobacco: Never   Vaping Use    Vaping Use: Never used   Substance and Sexual Activity    Alcohol use: Not Currently    Drug use: Never     Social Determinants of Health     Financial Resource Strain: Low Risk  (12/25/2021)    Overall Financial Resource Strain (CARDIA)     Difficulty of Paying Living Expenses: Not hard at all   Food Insecurity: No Food Insecurity (12/25/2021)    Hunger Vital Sign     Worried About Running Out of Food in the Last Year: Never true     Ran Out of Food in the Last Year: Never true   Transportation Needs: Unknown (12/25/2021)    PRAPARE - Transportation     Lack of Transportation (Non-Medical): No   Housing Stability: Unknown (12/25/2021)    Housing Stability Vital Sign     Unstable Housing in the Last Year: No        Family History   Problem Relation Age of Onset    Stroke Mother 6090  Heart  Attack Mother     Melanoma Father     Breast Cancer Sister     Breast Cancer Sister         PHYSICAL EXAM     BP 138/70 (Site: Left Upper Arm, Position: Sitting, Cuff Size: Large Adult)   Pulse 95   Temp 97.8 F (36.6 C) (Temporal)   Ht 1.486 m (4' 10.5")   Wt 66.3 kg (146 lb 3.2 oz)   SpO2 98%   BMI 30.04 kg/m    Physical Exam  Vitals reviewed.   Constitutional:       General: She is not in acute distress.     Appearance: Normal appearance.   HENT:      Head: Normocephalic and atraumatic.   Eyes:      Extraocular Movements: Extraocular movements intact.      Conjunctiva/sclera: Conjunctivae normal.      Pupils: Pupils are equal, round, and reactive to light.   Neck:      Vascular: No carotid bruit.   Cardiovascular:      Rate and Rhythm: Normal rate and regular rhythm.      Pulses: Normal pulses.      Heart sounds: No murmur heard.     No friction rub. No gallop.   Pulmonary:      Effort: Pulmonary effort is normal. No respiratory distress.      Breath sounds: Normal breath sounds. No wheezing, rhonchi or rales.   Abdominal:      General: Bowel sounds are normal. There is no distension.      Palpations: Abdomen is soft. There is no mass.      Tenderness: There is no abdominal tenderness. There is no guarding.      Hernia: A hernia (ventral hernia present, reducible, nontender) is present.   Musculoskeletal:         General: No swelling or deformity. Normal range of motion.      Cervical back: Normal range of motion and neck supple. No rigidity.   Lymphadenopathy:      Cervical: No cervical adenopathy.   Skin:     General: Skin is warm.      Coloration: Skin is not jaundiced or pale.      Findings: Erythema (right groin with 4 cm area well demarcated red rash) present. No bruising or rash.   Neurological:      General: No focal deficit present.      Mental Status: She is alert and oriented to person, place, and time.      Cranial Nerves: No cranial nerve deficit.      Motor: No weakness.      Gait: Gait  normal.      Deep Tendon Reflexes: Reflexes normal.   Psychiatric:         Mood and Affect: Mood normal.         Thought Content: Thought content normal.         The 10-year ASCVD risk score (Arnett DK, et al., 2019) is: 37.1%    Values used to calculate the score:      Age: 55 years      Sex: Female      Is Non-Hispanic African American: No      Diabetic: Yes      Tobacco smoker: No      Systolic Blood Pressure: 138 mmHg      Is BP treated: Yes      HDL Cholesterol: 51 mg/dL  Total Cholesterol: 165 mg/dL     Results for orders placed or performed during the hospital encounter of 12/23/21   CBC with Auto Differential   Result Value Ref Range    WBC 6.8 3.5 - 11.3 k/uL    RBC 4.19 3.95 - 5.11 m/uL    Hemoglobin 11.8 (L) 11.9 - 15.1 g/dL    Hematocrit 16.137.9 09.636.3 - 47.1 %    MCV 90.5 82.6 - 102.9 fL    MCH 28.2 25.2 - 33.5 pg    MCHC 31.1 28.4 - 34.8 g/dL    RDW 04.513.7 40.911.8 - 81.114.4 %    Platelets 260 138 - 453 k/uL    MPV 10.0 8.1 - 13.5 fL    NRBC Automated 0.0 0.0 per 100 WBC    Neutrophils % 63 36 - 65 %    Lymphocytes % 27 24 - 43 %    Monocytes % 7 3 - 12 %    Eosinophils % 2 1 - 4 %    Basophils % 1 0 - 2 %    Immature Granulocytes 0 0 %    Neutrophils Absolute 4.28 1.50 - 8.10 k/uL    Lymphocytes Absolute 1.85 1.10 - 3.70 k/uL    Monocytes Absolute 0.49 0.10 - 1.20 k/uL    Eosinophils Absolute 0.14 0.00 - 0.44 k/uL    Basophils Absolute 0.06 0.00 - 0.20 k/uL    Absolute Immature Granulocyte <0.03 0.00 - 0.30 k/uL   Comprehensive Metabolic Panel   Result Value Ref Range    Sodium 141 136 - 145 mmol/L    Potassium 4.3 3.7 - 5.3 mmol/L    Chloride 103 98 - 107 mmol/L    CO2 26 20 - 31 mmol/L    Anion Gap 12 9 - 16 mmol/L    Glucose 103 (H) 74 - 99 mg/dL    BUN 26 (H) 8 - 23 mg/dL    Creatinine 0.9 9.140.50 - 0.90 mg/dL    Est, Glom Filt Rate >60 >60 mL/min/1.3373m2    Calcium 9.8 8.6 - 10.4 mg/dL    Total Protein 7.7 6.6 - 8.7 g/dL    Albumin 4.6 3.5 - 5.2 g/dL    Albumin/Globulin Ratio 1.0 1.0 - 2.5    Total Bilirubin  0.3 0.00 - 1.20 mg/dL    Alkaline Phosphatase 92 35 - 104 U/L    ALT 22 10 - 35 U/L    AST 29 10 - 35 U/L   Hemoglobin A1C   Result Value Ref Range    Hemoglobin A1C 6.0 4.0 - 6.0 %    Estimated Avg Glucose 126 mg/dL   Lipid Panel   Result Value Ref Range    Cholesterol 165 0 - 199 mg/dL    HDL 51 >78>40 mg/dL    LDL Cholesterol 93 0 - 100 mg/dL    Chol/HDL Ratio 3.0     Triglycerides 107 0 - 149 mg/dL    VLDL 21 mg/dL   Magnesium   Result Value Ref Range    Magnesium 1.9 1.6 - 2.4 mg/dL   Microalbumin, Ur   Result Value Ref Range    Microalb, Ur 31 (H) <21 mg/L    Creatinine, Ur 58.3 28.0 - 217.0 mg/dL    Microalb/Crt. Ratio 53 (H) <25 mcg/mg creat   Vitamin D 25 Hydroxy   Result Value Ref Range    Vit D, 25-Hydroxy 54.6 30.0 - 100.0 ng/mL      ASSESSMENT/PLAN  1. Anxiety  -     ALPRAZolam (XANAX) 0.25 MG tablet; Take 1 tablet by mouth daily as needed for Anxiety for up to 30 days. Max Daily Amount: 0.25 mg, Disp-30 tablet, R-0Normal  O[facility] report done.  No red flags.  Medication contract signed.  Refilled the Xanax which she can use sparingly for increased anxiety dealing with her father now on hospice and husband who had a recent stroke.  2. Major depressive disorder, recurrent, moderate  Continue on Cymbalta 60 mg daily.  Patient does have friends and family that she can speak to and also somebody on the pastoral staff at her church.  3. Candidiasis  Patient will use Lotrisone that she has at home twice a day to the affected area.  Call if symptoms change or worsen.  4. Type 2 diabetes mellitus without complication, without long-term current use of insulin (HCC)  -     pioglitazone (ACTOS) 15 MG tablet; Take 1 tablet by mouth daily, Disp-90 tablet, R-3Normal  Patient reports that her continuous glucose monitoring 30-day average is at 137 which is good.  Her last A1c was 6.0.  Continue the pioglitazone 15 mg daily which was refilled.  Continue metformin 1000 mg twice a day and continue glimepiride 2 mg 1/2 tablet  daily.  Continue diabetic diet and keep follow-up with eye doctor.  6. Intrahepatic cholangiocarcinoma (HCC)  Recent letter from oncologist reviewed.  He said no evidence of recurrence.  She did recently have a CT done as well.  She sees Dr. Eulah Pont at the [facility]  7. Essential hypertension  -     spironolactone (ALDACTONE) 50 MG tablet; Take 1 tablet by mouth every other day, Disp-45 tablet, R-3Normal  -     furosemide (LASIX) 20 MG tablet; Take 1 tablet by mouth every other day, Disp-45 tablet, R-2Normal  Blood pressure is stable and well-controlled she is on amlodipine 5 mg daily and alternating between Lasix 20 mg and spironolactone 50 mg.  Patient already has lab ordered that she will do before the next visit.  8. Malignant neoplasm of urinary bladder, unspecified site [facility])  Keep follow-up appointment with urologist and oncologist.  9. Gastroesophageal reflux disease without esophagitis  Continue omeprazole 20 mg daily.  She does have some occasional breakthrough symptoms and she can double her dose of omeprazole to 40 mg as needed.  10. Mixed hyperlipidemia  Patient is supposed to be on atorvastatin 80 mg but she did not bring that bottle with her so not sure if she is actually taking it she does have labs ordered to be done prior to next visit    Return for keep upcoming appointment. In June    COMMUNICATION:       Electronically signed by Gary Fleet, PA-C on 04/29/2022 at 12:42 PM

## 2022-06-01 MED ORDER — METFORMIN HCL 1000 MG PO TABS
1000 MG | ORAL_TABLET | Freq: Two times a day (BID) | ORAL | 0 refills | Status: AC
Start: 2022-06-01 — End: 2022-08-28

## 2022-06-01 NOTE — Telephone Encounter (Signed)
Amy Arellano is calling to request a refill on the following medication(s):    Medication Request:  Requested Prescriptions     Pending Prescriptions Disp Refills    metFORMIN (GLUCOPHAGE) 1000 MG tablet [Pharmacy Med Name: metFORMIN HCl Oral Tablet 1000 MG] 180 tablet 0     Sig: TAKE 1 TABLET BY MOUTH 2 TIMES A DAY       Last Visit Date (If Applicable):  04/29/22  Next Visit Date:    07/03/22

## 2022-06-26 LAB — CMP, EXTERNAL

## 2022-06-26 LAB — HEMOGLOBIN A1C
Estimated Avg Glucose: 151
Hemoglobin A1C: 6.9 %

## 2022-06-26 LAB — MAGNESIUM: Magnesium: 1.7 mg/dL

## 2022-07-03 ENCOUNTER — Encounter
Admit: 2022-07-03 | Discharge: 2022-07-03 | Payer: MEDICARE | Attending: Physician Assistant | Primary: Internal Medicine

## 2022-07-03 DIAGNOSIS — F419 Anxiety disorder, unspecified: Secondary | ICD-10-CM

## 2022-07-03 MED ORDER — AMLODIPINE BESYLATE 5 MG PO TABS
5 MG | ORAL_TABLET | Freq: Every day | ORAL | 3 refills | Status: DC
Start: 2022-07-03 — End: 2023-07-21

## 2022-07-03 NOTE — Progress Notes (Signed)
[facility]     Date of Visit:  07/03/2022  Patient Name: Amy Arellano   Patient DOB:  Jun 11, 1945     CHIEF COMPLAINT:     DEBBORAH GHEZZI is a 77 y.o. female who presents today for an general visit to be evaluated for the following condition(s):  Chief Complaint   Patient presents with    6 Month Follow-Up     dm       HISTORY OF PRESENT ILLNESS      Patient's father passed away so she is not dealing with him in hospice anymore but she still has a lot to deal with her husband who has health problems.  She has not had to take the Xanax much at all since last visit.  Cymbalta is working and she does have an appointment coming up with a counselor whom she has seen in the past.  She tends to eat worse and more when she is under stress which caused her to gain 8 pounds over the winter and also sugars have been little more elevated than normal.  Mammogram not done yet.  She has an upcoming appointment in September with oncologist through [facility].  She recently saw urologist and had a cystoscopy at the end of April also through [facility] clinic.    REVIEW OF SYSTEMS     Review of Systems   Constitutional:  Negative for chills, fever and unexpected weight change.   HENT:  Negative for congestion, ear pain, hearing loss, rhinorrhea, sinus pressure, sinus pain, sneezing and sore throat.    Eyes:  Negative for pain, redness and visual disturbance.   Respiratory:  Negative for cough, shortness of breath and wheezing.    Cardiovascular:  Negative for chest pain, palpitations and leg swelling.   Gastrointestinal:  Negative for abdominal pain, blood in stool, constipation, diarrhea, nausea and vomiting.   Endocrine: Negative for cold intolerance and heat intolerance.   Genitourinary:  Negative for difficulty urinating, dysuria, frequency, hematuria and urgency.   Musculoskeletal:  Negative for arthralgias, back pain, gait problem, joint swelling, myalgias and neck pain.   Skin:  Negative for rash and wound.    Allergic/Immunologic: Negative for immunocompromised state.   Neurological:  Negative for dizziness, tremors, seizures, syncope, speech difficulty, weakness, light-headedness, numbness and headaches.   Psychiatric/Behavioral:  Negative for confusion, hallucinations and sleep disturbance. The patient is not nervous/anxious.         REVIEWED INFORMATION      Current Outpatient Medications   Medication Sig Dispense Refill    amLODIPine (NORVASC) 5 MG tablet Take 1 tablet by mouth daily 90 tablet 3    metFORMIN (GLUCOPHAGE) 1000 MG tablet TAKE 1 TABLET BY MOUTH 2 TIMES A DAY 180 tablet 0    pioglitazone (ACTOS) 15 MG tablet Take 1 tablet by mouth daily 90 tablet 3    spironolactone (ALDACTONE) 50 MG tablet Take 1 tablet by mouth every other day 45 tablet 3    furosemide (LASIX) 20 MG tablet Take 1 tablet by mouth every other day 45 tablet 2    ALPRAZolam (XANAX) 0.25 MG tablet Take 1 tablet by mouth daily as needed for Anxiety for up to 30 days. Max Daily Amount: 0.25 mg 30 tablet 0    magnesium Oxide 500 MG TABS Take 1.5 tablets by mouth daily      omeprazole (PRILOSEC) 20 MG delayed release capsule Take 1 capsule by mouth daily      DULoxetine (CYMBALTA)  60 MG extended release capsule Take 1 capsule by mouth daily 90 capsule 3    glimepiride (AMARYL) 2 MG tablet Take 0.5 tablets by mouth daily      Cholecalciferol 50 MCG (2000 UT) TABS Take 2,000 Units by mouth daily       No current facility-administered medications for this visit.        Allergies   Allergen Reactions    Oxycodone Itching    Rocephin [Ceftriaxone] Rash     Developed a diffuse, widespread maculopapular rash 1 day after starting ceftriaxone with minimal urticaria/edema.       Patient Active Problem List   Diagnosis    Hypomagnesemia    Type 2 diabetes mellitus without complication, without long-term current use of insulin (HCC)    Intrahepatic cholangiocarcinoma (HCC)    Bladder cancer (HCC)    Hypokalemia    Mixed hyperlipidemia    Gastroesophageal  reflux disease without esophagitis    Essential hypertension    Moderate episode of recurrent major depressive disorder (HCC)    Major depressive disorder, recurrent, moderate    Anxiety    Nonrheumatic pulmonary valve insufficiency    Mitral valve annular calcification       Past Medical History:   Diagnosis Date    Cancer (HCC)     Diabetes mellitus (HCC)        Past Surgical History:   Procedure Laterality Date    CARPAL TUNNEL RELEASE Bilateral     CHOLECYSTECTOMY  04/2016    [facility]    COLONOSCOPY  04/2016    [place] [facility]    CYSTOSCOPY  04/2022    Dr. Dorthea Cove at [facility]    LIVER RESECTION  04/2016    [facility]    THORACENTESIS  04/2016    TRANSURETHRAL RESECTION OF BLADDER TUMOR  01/2016    [facility] [facility]        Social History     Socioeconomic History    Marital status: Married     Spouse name: None    Number of children: None    Years of education: None    Highest education level: None   Tobacco Use    Smoking status: Former     Current packs/day: 0.00     Average packs/day: 1 pack/day for 26.0 years (26.0 ttl pk-yrs)     Types: Cigarettes     Start date: 01/27/1967     Quit date: 01/26/1993     Years since quitting: 29.4    Smokeless tobacco: Never   Vaping Use    Vaping Use: Never used   Substance and Sexual Activity    Alcohol use: Not Currently    Drug use: Never     Social Determinants of Health     Financial Resource Strain: Low Risk  (12/25/2021)    Overall Financial Resource Strain (CARDIA)     Difficulty of Paying Living Expenses: Not hard at all   Food Insecurity: No Food Insecurity (12/25/2021)    Hunger Vital Sign     Worried About Running Out of Food in the Last Year: Never true     Ran Out of Food in the Last Year: Never true   Transportation Needs: Unknown (12/25/2021)    PRAPARE - Transportation     Lack of Transportation (Non-Medical): No   Housing Stability: Unknown (12/25/2021)    Housing Stability Vital Sign     Unstable Housing in the Last Year:  No  Family History   Problem Relation Age of Onset    Stroke Mother 24    Heart Attack Mother     Melanoma Father     Breast Cancer Sister     Breast Cancer Sister         PHYSICAL EXAM     BP 138/70 (Site: Right Upper Arm, Position: Sitting, Cuff Size: Large Adult)   Pulse 84   Temp 97.9 F (36.6 C) (Temporal)   Ht 1.486 m (4' 10.5")   Wt 67.5 kg (148 lb 12.8 oz)   SpO2 97%   BMI 30.57 kg/m    Physical Exam  Vitals reviewed.   Constitutional:       General: She is not in acute distress.     Appearance: Normal appearance.   HENT:      Head: Normocephalic and atraumatic.   Eyes:      Extraocular Movements: Extraocular movements intact.      Conjunctiva/sclera: Conjunctivae normal.      Pupils: Pupils are equal, round, and reactive to light.   Neck:      Vascular: No carotid bruit.   Cardiovascular:      Rate and Rhythm: Normal rate and regular rhythm.      Pulses: Normal pulses.      Heart sounds: Murmur (1/6 SEM RSB) heard.      No friction rub. No gallop.   Pulmonary:      Effort: Pulmonary effort is normal. No respiratory distress.      Breath sounds: Normal breath sounds. No wheezing, rhonchi or rales.   Abdominal:      General: Bowel sounds are normal. There is no distension.      Palpations: Abdomen is soft. There is no mass.      Tenderness: There is no abdominal tenderness. There is no guarding.   Musculoskeletal:         General: No swelling or deformity. Normal range of motion.      Cervical back: Normal range of motion and neck supple. No rigidity.   Lymphadenopathy:      Cervical: No cervical adenopathy.   Skin:     General: Skin is warm.      Coloration: Skin is not jaundiced or pale.      Findings: No bruising or rash.   Neurological:      General: No focal deficit present.      Mental Status: She is alert and oriented to person, place, and time.      Cranial Nerves: No cranial nerve deficit.      Sensory: Sensation is intact.      Motor: Motor function is intact. No weakness.      Gait:  Gait normal.      Deep Tendon Reflexes: Reflexes normal.   Psychiatric:         Mood and Affect: Mood normal.         Thought Content: Thought content normal.     Visual inspection:  Deformity/amputation: present - no  Skin lesions/pre-ulcerative calluses: absent  Edema: right- negative, left- negative    Sensory exam:  Monofilament sensation: normal  (minimum of 5 random plantar locations tested, avoiding callused areas - > 1 area with absence of sensation is + for neuropathy)    Plus at least one of the following:  Pulses: normal,   Pinprick: N/A  Proprioception: N/A  Vibration (128 Hz): N/A     The 10-year ASCVD risk score (Arnett DK, et al., 2019)  is: 40.7%    Values used to calculate the score:      Age: 41 years      Sex: Female      Is Non-Hispanic African American: No      Diabetic: Yes      Tobacco smoker: No      Systolic Blood Pressure: 138 mmHg      Is BP treated: Yes      HDL Cholesterol: 51 mg/dL      Total Cholesterol: 165 mg/dL     Results for orders placed or performed in visit on 06/29/22   CMP, EXTERNAL   Result Value Ref Range    Sodium, External      Potassium, External      Chloride, EXTERNAL      CO2, External      ANION GAP, EXTERNAL      GLUCOSE SER, EXTERNAL      BUN, EXTERNAL      Creatinine, External      BUN/CREATININE RATIO, EXTERNAL      EGFR IF AFA, EXTERNAL      eGFR NON-AA      Calcium, EXTERNAL      CALCIUM, ION, EXTERNAL      BILI TOTAL, EXTERNAL      BILI DIRECT, EXTERNAL      SGPT (ALT), EXTERNAL      SGOT (AST), EXTERNAL      ALK PHOS, EXTERNAL      PROTEIN TOT, EXTERNAL      ALBUMIN, EXTERNAL      GLOBULIN, EXTERNAL      A/G RATIO, EXTERNAL     Hemoglobin A1C   Result Value Ref Range    Hemoglobin A1C 6.9 %    Estimated Avg Glucose 151    Magnesium   Result Value Ref Range    Magnesium 1.7 mg/dL      ASSESSMENT/PLAN     1. Anxiety  Continue Cymbalta as daily and Xanax as needed and keep upcoming appointment with counselor.  OARRS report done.  No red flags.  2. Major  depressive disorder, recurrent, moderate  Continue Cymbalta and Xanax as needed and keep upcoming appointment with counselor  3. Type 2 diabetes mellitus without complication, without long-term current use of insulin (HCC)  -     Hemoglobin A1C; Future  -     Basic Metabolic Panel; Future  Diabetes is still controlled but A1c did increase to 6.9.  Discussed getting back on strict diabetic diet and exercise.  For now continue pioglitazone 15 mg daily, glimepiride 2 mg 1/2 tablet daily and metformin 1000 mg twice a day.  Overdue for appointment with eye doctor.  5. Intrahepatic cholangiocarcinoma [facility])  Next appointment with oncology at [facility] is in September.  No evidence of recurrence.  6. Essential hypertension  -     amLODIPine (NORVASC) 5 MG tablet; Take 1 tablet by mouth daily, Disp-90 tablet, R-3Normal  Blood pressure is stable and controlled on amlodipine 5 mg daily.  She also alternates between Lasix 20 mg and spironolactone 50 mg.  Recent electrolytes were normal.  Kidney function also in normal range.  7. Malignant neoplasm of urinary bladder, unspecified site [facility])  Patient had a cystoscopy at the end of April per urologist at [facility] and no evidence of recurrence.  8. Mixed hyperlipidemia  Patient is not on atorvastatin and has not been on it for several years.  Her last lipids in November were excellent.  9. Hypomagnesemia  Continue magnesium 400 mg twice a day  10. Colon cancer screening  -     Amb External Referral To Gastroenterology  11. Vitamin B12 deficiency  -     Vitamin B12; Future  12. Nonrheumatic pulmonary valve insufficiency  Last echo reviewed from 2021.  She did have mild pulmonic valve regurgitation and also mitral valve calcification again mild.  She has a 1/6  heart murmur at the right sternal border  13. Mitral valve annular calcification     Also advised patient to get up-to-date on shingles vaccine  Return in about 3 months (around 10/03/2022) for f/u  DM.    COMMUNICATION:       Electronically signed by Gary Fleet, PA-C on 07/03/2022 at 9:44 AM

## 2022-07-14 ENCOUNTER — Inpatient Hospital Stay: Payer: MEDICARE | Primary: Physician Assistant

## 2022-07-14 DIAGNOSIS — E538 Deficiency of other specified B group vitamins: Secondary | ICD-10-CM

## 2022-07-14 LAB — VITAMIN B12: Vitamin B-12: 373 pg/mL (ref 232–1245)

## 2022-08-24 ENCOUNTER — Ambulatory Visit: Admit: 2022-08-24 | Discharge: 2022-08-24 | Payer: MEDICARE | Attending: Internal Medicine | Primary: Internal Medicine

## 2022-08-24 DIAGNOSIS — R0602 Shortness of breath: Secondary | ICD-10-CM
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
## 2022-08-27 ENCOUNTER — Encounter

## 2022-08-27 NOTE — Telephone Encounter (Signed)
PENNIE VANBLARCOM is calling to request a refill on the following medication(s):    Last Visit Date (If Applicable):  07/03/2022    Next Visit Date:    09/23/2022    Medication Request:  Requested Prescriptions     Pending Prescriptions Disp Refills    metFORMIN (GLUCOPHAGE) 1000 MG tablet [Pharmacy Med Name: metFORMIN HCl Oral Tablet 1000 MG] 180 tablet 0     Sig: TAKE 1 TABLET BY MOUTH 2 TIMES A DAY

## 2022-08-28 MED ORDER — METFORMIN HCL 1000 MG PO TABS
1000 | ORAL_TABLET | Freq: Two times a day (BID) | ORAL | 3 refills | 30.00000 days | Status: DC
Start: 2022-08-28 — End: 2023-08-23

## 2022-09-23 ENCOUNTER — Encounter: Payer: Medicare Other | Attending: Physician Assistant | Primary: Internal Medicine

## 2022-10-01 ENCOUNTER — Encounter: Payer: Medicare Other | Attending: Physician Assistant | Primary: Internal Medicine

## 2022-10-08 ENCOUNTER — Encounter: Payer: MEDICARE | Attending: Internal Medicine | Primary: Internal Medicine

## 2022-10-12 LAB — CBC WITH AUTO DIFFERENTIAL
Basophils %: 1 % (ref 0–2)
Basophils Absolute: 0.06 10*3/uL (ref 0.00–0.20)
Eosinophils %: 2 % (ref 1–4)
Eosinophils Absolute: 0.17 10*3/uL (ref 0.00–0.44)
Hematocrit: 34.1 % — ABNORMAL LOW (ref 36.3–47.1)
Hemoglobin: 9.9 g/dL — ABNORMAL LOW (ref 11.9–15.1)
Immature Granulocytes %: 1 % — ABNORMAL HIGH
Immature Granulocytes Absolute: 0.05 10*3/uL (ref 0.00–0.30)
Lymphocytes %: 23 % — ABNORMAL LOW (ref 24–43)
Lymphocytes Absolute: 1.68 10*3/uL (ref 1.10–3.70)
MCH: 22.6 pg — ABNORMAL LOW (ref 25.2–33.5)
MCHC: 29 g/dL (ref 28.4–34.8)
MCV: 77.7 fL — ABNORMAL LOW (ref 82.6–102.9)
MPV: 10.8 fL (ref 8.1–13.5)
Monocytes %: 6 % (ref 3–12)
Monocytes Absolute: 0.47 10*3/uL (ref 0.10–1.20)
NRBC Automated: 0 /100{WBCs}
Neutrophils %: 67 % — ABNORMAL HIGH (ref 36–65)
Neutrophils Absolute: 4.99 10*3/uL (ref 1.50–8.10)
Platelets: 297 10*3/uL (ref 138–453)
RBC: 4.39 m/uL (ref 3.95–5.11)
RDW: 15.8 % — ABNORMAL HIGH (ref 11.8–14.4)
WBC: 7.4 10*3/uL (ref 3.5–11.3)

## 2022-10-12 LAB — COMPREHENSIVE METABOLIC PANEL
ALT: 22 U/L (ref 10–35)
AST: 30 U/L (ref 10–35)
Albumin/Globulin Ratio: 2 (ref 1.0–2.5)
Albumin: 4.8 g/dL (ref 3.5–5.2)
Alkaline Phosphatase: 118 U/L — ABNORMAL HIGH (ref 35–104)
Anion Gap: 12 mmol/L (ref 9–16)
BUN: 23 mg/dL (ref 8–23)
CO2: 25 mmol/L (ref 20–31)
Calcium: 9.9 mg/dL (ref 8.6–10.4)
Chloride: 101 mmol/L (ref 98–107)
Creatinine: 1 mg/dL — ABNORMAL HIGH (ref 0.50–0.90)
Est, Glom Filt Rate: 62 mL/min/{1.73_m2} (ref >60)
Glucose: 164 mg/dL — ABNORMAL HIGH (ref 74–99)
Potassium: 4.8 mmol/L (ref 3.7–5.3)
Sodium: 138 mmol/L (ref 136–145)
Total Bilirubin: 0.4 mg/dL (ref 0.00–1.20)
Total Protein: 7.4 g/dL (ref 6.6–8.7)

## 2022-10-12 LAB — HEMOGLOBIN A1C
Estimated Avg Glucose: 151 mg/dL
Hemoglobin A1C: 6.9 % — ABNORMAL HIGH (ref 4.0–6.0)

## 2022-10-15 ENCOUNTER — Encounter: Payer: MEDICARE | Attending: Physician Assistant | Primary: Internal Medicine

## 2022-10-28 ENCOUNTER — Ambulatory Visit
Admit: 2022-10-28 | Discharge: 2022-10-28 | Payer: MEDICARE | Attending: Physician Assistant | Primary: Internal Medicine

## 2022-10-28 ENCOUNTER — Inpatient Hospital Stay: Payer: MEDICARE | Primary: Internal Medicine

## 2022-10-28 VITALS — BP 150/80 | HR 84 | Temp 97.70000°F | Ht 58.5 in | Wt 145.2 lb

## 2022-10-28 DIAGNOSIS — D508 Other iron deficiency anemias: Secondary | ICD-10-CM

## 2022-10-28 LAB — POCT OCCULT BLOOD STOOL NON CA SCREEN: Occult Blood Fecal: NEGATIVE

## 2022-10-28 MED ORDER — ALPRAZOLAM 0.25 MG PO TABS
0.25 | ORAL_TABLET | Freq: Every day | ORAL | 0 refills | Status: DC | PRN
Start: 2022-10-28 — End: 2023-03-02

## 2022-10-28 NOTE — Progress Notes (Signed)
 [facility]     Date of Visit:  10/28/2022  Patient Name: Amy Arellano   Patient DOB:  [id number]     CHIEF COMPLAINT:     Amy Arellano is a 77 y.o. female who presents today for an general visit to be evaluated for the following condition(s):  Chief Complaint   Patient presents with    1 Month Follow-Up       HISTORY OF PRESENT ILLNESS    Had some testing done through [facility] clinic including a CT of the chest and CT abdomen pelvis and a CA 19-9.  Because of the CT abdomen and pelvis indicating an enlarging liver nodule she is scheduled for an MRI now.  Has MRI liver scheduled this Friday, f/u with DR. Beverley, oncologist after the MRI  Taking glimepiride  2 mg 1 tab in am and 1/2 tab pm since the pioglitazone  was stopped at last visit.  Patient did also have blood work that done that Dr. Lawyer had ordered and is here to review that.  No chest pains.  No dizziness.  No lightheadedness.  No GI symptoms.  No abdominal pain.  No blood or black stools.  No change in bowel movements.  No back pain.  The shortness of breath she complained about at last visit is still present but much improved.  She did not have the echo done yet or the chest x-ray although she did have a CT of the chest recently through [facility].    REVIEW OF SYSTEMS     Review of Systems   Constitutional:  Negative for chills, fever and unexpected weight change.   HENT:  Negative for congestion, ear pain, hearing loss, rhinorrhea, sinus pressure, sinus pain, sneezing and sore throat.    Eyes:  Negative for pain, redness and visual disturbance.   Respiratory:  Positive for shortness of breath. Negative for cough and wheezing.    Cardiovascular:  Negative for chest pain, palpitations and leg swelling.   Gastrointestinal:  Negative for abdominal pain, blood in stool, constipation, diarrhea, nausea and vomiting.   Endocrine: Negative for cold intolerance and heat intolerance.   Genitourinary:  Negative for difficulty urinating, dysuria,  frequency, hematuria and urgency.   Musculoskeletal:  Negative for arthralgias, back pain, gait problem, joint swelling, myalgias and neck pain.   Skin:  Negative for rash and wound.   Allergic/Immunologic: Negative for immunocompromised state.   Neurological:  Negative for dizziness, tremors, seizures, syncope, speech difficulty, weakness, light-headedness, numbness and headaches.   Psychiatric/Behavioral:  Positive for dysphoric mood. Negative for confusion, hallucinations and sleep disturbance. The patient is nervous/anxious.         REVIEWED INFORMATION      Current Outpatient Medications   Medication Sig Dispense Refill    ALPRAZolam  (XANAX ) 0.25 MG tablet Take 1 tablet by mouth daily as needed for Anxiety for up to 30 days. Max Daily Amount: 0.25 mg 30 tablet 0    metFORMIN  (GLUCOPHAGE ) 1000 MG tablet Take 1 tablet by mouth 2 times daily 180 tablet 3    Continuous Glucose Sensor (DEXCOM G7 SENSOR) MISC as directed change snesor every 10 days for 30      vitamin B-12 (CYANOCOBALAMIN) 1000 MCG tablet Take 1 tablet by mouth daily      amLODIPine  (NORVASC ) 5 MG tablet Take 1 tablet by mouth daily 90 tablet 3    spironolactone  (ALDACTONE ) 50 MG tablet Take 1 tablet by mouth every other day 45 tablet 3  furosemide  (LASIX ) 20 MG tablet Take 1 tablet by mouth every other day 45 tablet 2    magnesium  Oxide 500 MG TABS Take 1.5 tablets by mouth daily      omeprazole (PRILOSEC) 20 MG delayed release capsule Take 1 capsule by mouth daily      DULoxetine  (CYMBALTA ) 60 MG extended release capsule Take 1 capsule by mouth daily 90 capsule 3    glimepiride  (AMARYL ) 2 MG tablet Take 1 tablet by mouth daily      Cholecalciferol 50 MCG (2000 UT) TABS Take 1 tablet by mouth daily       No current facility-administered medications for this visit.        Allergies   Allergen Reactions    Oxycodone Itching    Rocephin  [Ceftriaxone ] Rash     Developed a diffuse, widespread maculopapular rash 1 day after starting ceftriaxone  with  minimal urticaria/edema.       Patient Active Problem List   Diagnosis    Hypomagnesemia    Type 2 diabetes mellitus without complication, without long-term current use of insulin  (HCC)    Intrahepatic cholangiocarcinoma (HCC)    Bladder cancer (HCC)    Hypokalemia    Mixed hyperlipidemia    Gastroesophageal reflux disease without esophagitis    Essential hypertension    Moderate episode of recurrent major depressive disorder (HCC)    Major depressive disorder, recurrent, moderate    Anxiety    Nonrheumatic pulmonary valve insufficiency    Mitral valve annular calcification    Vitamin B12 deficiency    Iron deficiency anemia       Past Medical History:   Diagnosis Date    Cancer (HCC)     Diabetes mellitus (HCC)        Past Surgical History:   Procedure Laterality Date    CARPAL TUNNEL RELEASE Bilateral     CHOLECYSTECTOMY  04/2016    [facility]    COLONOSCOPY  04/2016    [place] [facility]    CYSTOSCOPY  04/2022    Dr. Marlee Quivers at [facility]    LIVER RESECTION  04/2016    [facility]    THORACENTESIS  04/2016    TRANSURETHRAL RESECTION OF BLADDER TUMOR  01/2016    [facility]        Social History     Socioeconomic History    Marital status: Married     Spouse name: None    Number of children: None    Years of education: None    Highest education level: None   Tobacco Use    Smoking status: Former     Current packs/day: 0.00     Average packs/day: 1 pack/day for 26.0 years (26.0 ttl pk-yrs)     Types: Cigarettes     Start date: 01/27/1967     Quit date: 01/26/1993     Years since quitting: 29.7    Smokeless tobacco: Never   Vaping Use    Vaping status: Never Used   Substance and Sexual Activity    Alcohol use: Not Currently    Drug use: Never     Social Determinants of Health     Financial Resource Strain: Low Risk  (12/25/2021)    Overall Financial Resource Strain (CARDIA)     Difficulty of Paying Living Expenses: Not hard at all   Food Insecurity: No Food Insecurity (12/25/2021)     Hunger Vital Sign     Worried About Running Out of Food  in the Last Year: Never true     Ran Out of Food in the Last Year: Never true   Transportation Needs: Unknown (12/25/2021)    PRAPARE - Transportation     Lack of Transportation (Non-Medical): No   Housing Stability: Unknown (12/25/2021)    Housing Stability Vital Sign     Unstable Housing in the Last Year: No        Family History   Problem Relation Age of Onset    Stroke Mother 66    Heart Attack Mother     Melanoma Father     Breast Cancer Sister     Breast Cancer Sister         PHYSICAL EXAM     BP (!) 150/80 (Site: Left Upper Arm, Position: Sitting, Cuff Size: Medium Adult)   Pulse 84   Temp 97.7 F (36.5 C) (Temporal)   Ht 1.486 m (4' 10.5)   Wt 65.9 kg (145 lb 3.2 oz)   SpO2 97%   BMI 29.83 kg/m    Physical Exam  Vitals reviewed.   Constitutional:       General: She is not in acute distress.     Appearance: Normal appearance.   HENT:      Head: Normocephalic and atraumatic.      Right Ear: Tympanic membrane and external ear normal.      Left Ear: Tympanic membrane and external ear normal.      Nose: Nose normal.      Mouth/Throat:      Mouth: Mucous membranes are moist.      Pharynx: No oropharyngeal exudate or posterior oropharyngeal erythema.   Eyes:      Extraocular Movements: Extraocular movements intact.      Conjunctiva/sclera: Conjunctivae normal.      Pupils: Pupils are equal, round, and reactive to light.   Neck:      Vascular: No carotid bruit.   Cardiovascular:      Rate and Rhythm: Normal rate and regular rhythm.      Pulses: Normal pulses.      Heart sounds: No murmur heard.     No friction rub. No gallop.   Pulmonary:      Effort: Pulmonary effort is normal. No respiratory distress.      Breath sounds: Normal breath sounds. No wheezing, rhonchi or rales.   Abdominal:      General: Bowel sounds are normal. There is no distension.      Palpations: Abdomen is soft. There is no mass.      Tenderness: There is no abdominal tenderness.  There is no guarding.   Genitourinary:     Rectum: Normal. Guaiac result negative.   Musculoskeletal:         General: No swelling or deformity. Normal range of motion.      Cervical back: Normal range of motion and neck supple. No rigidity.   Lymphadenopathy:      Cervical: No cervical adenopathy.   Skin:     General: Skin is warm.      Coloration: Skin is not jaundiced or pale.      Findings: No bruising or rash.   Neurological:      General: No focal deficit present.      Mental Status: She is alert and oriented to person, place, and time.      Cranial Nerves: No cranial nerve deficit.      Motor: No weakness.      Gait: Gait normal.  Deep Tendon Reflexes: Reflexes normal.   Psychiatric:         Mood and Affect: Mood normal.         Thought Content: Thought content normal.         The 10-year ASCVD risk score (Arnett DK, et al., 2019) is: 46.1%    Values used to calculate the score:      Age: 37 years      Sex: Female      Is Non-Hispanic African American: No      Diabetic: Yes      Tobacco smoker: No      Systolic Blood Pressure: 150 mmHg      Is BP treated: Yes      HDL Cholesterol: 51 mg/dL      Total Cholesterol: 165 mg/dL     Results for orders placed or performed in visit on 10/28/22   POCT Blood Occult   Result Value Ref Range    Occult Blood Fecal negative     Occult Blood Fecal      Occult Blood Fecal        ASSESSMENT/PLAN     1. Other iron deficiency anemia  -     Iron and TIBC; Future  -     Ferritin; Future  -     Amb External Referral To Gastroenterology  -     POCT Blood Occult  Patient's hemoglobin is dropped down to 9.9.  She does have a known B12 deficiency.  Vitamin B12 was ordered but she also has a history of iron deficiency in the past and her MCV and MCH are low indicating probable iron deficiency again.  Hemoccult in the office is negative and she denies any GI symptoms.  She did recently have a CT of the abdomen and pelvis.  In June she had been referred back to GI for colonoscopy but  the appointment was never made.  Referral given again to Dr. corman for at least a colonoscopy and she may need an EGD as well.  She is on omeprazole 20 mg daily and not taking any NSAIDs.  2. Intrahepatic cholangiocarcinoma (HCC)  Recent CT of the abdomen and pelvis with an enlarging liver lesion for which an MRI has already been ordered by [facility] clinic.  Patient does have history of cancer with a liver resection and gallbladder removal in 2018.  She has a follow-up appointment with her oncologist at [facility] Dr. Beverley in a couple of weeks  3. Type 2 diabetes mellitus without complication, without long-term current use of insulin  (HCC)  Recent A1c reviewed.  Good control with metformin  at 1000 mg twice a day glimepiride  2 mg taking 1 tablet in the morning and a half a tablet at night.  Continue to monitor blood sugar.  Continue with diabetic diet.  4. Anxiety  -     ALPRAZolam  (XANAX ) 0.25 MG tablet; Take 1 tablet by mouth daily as needed for Anxiety for up to 30 days. Max Daily Amount: 0.25 mg, Disp-30 tablet, R-0Normal  Increased anxiety lately with stress dealing with her husband but also increased worry about upcoming MRI and liver lesion.  Refilled her Xanax .  [facility] report done.  No red flags.  5. Essential hypertension  Blood pressure is slightly elevated today but patient is under a lot of stress.  Not going to change her medication today continue amlodipine  5 mg daily and furosemide  20 mg alternating with spironolactone  50 mg.  She will follow-up here  in a few weeks to recheck blood pressure.  6. Vitamin B12 deficiency  -     Vitamin B12; Future  7. Malignant neoplasm of urinary bladder, unspecified site [facility])  Keep follow-up with oncologist  8. Mixed hyperlipidemia  Continue low-cholesterol diet  9. Major depressive disorder, recurrent, moderate (HCC)  Continue the Cymbalta  60 mg daily    Return in about 3 weeks (around 11/18/2022) for 3-4 wks f/u with Dr. Lawyer, review testing,  anemia.    COMMUNICATION:       Electronically signed by Leita LITTIE Molly, PA-C on 10/28/2022 at 12:27 PM

## 2022-10-29 LAB — VITAMIN B12: Vitamin B-12: 735 pg/mL (ref 232–1245)

## 2022-10-29 LAB — FERRITIN: Ferritin: 12 ng/mL — ABNORMAL LOW (ref 13–150)

## 2022-10-29 LAB — IRON AND TIBC
Iron % Saturation: 8 % — ABNORMAL LOW (ref 20–55)
Iron: 39 ug/dL (ref 37–145)
TIBC: 507 ug/dL — ABNORMAL HIGH (ref 250–450)
UIBC: 468 ug/dL — ABNORMAL HIGH (ref 112–347)

## 2022-11-19 ENCOUNTER — Inpatient Hospital Stay: Admit: 2022-11-19 | Discharge: 2022-11-24 | Payer: MEDICARE | Attending: Internal Medicine | Primary: Internal Medicine

## 2022-11-19 ENCOUNTER — Inpatient Hospital Stay: Admit: 2022-11-19 | Payer: MEDICARE | Attending: Internal Medicine | Primary: Internal Medicine

## 2022-11-19 DIAGNOSIS — R0602 Shortness of breath: Secondary | ICD-10-CM

## 2022-11-19 LAB — ECHO (TTE) COMPLETE (PRN CONTRAST/BUBBLE/STRAIN/3D)
AV Area by Peak Velocity: 2.9 cm2
AV Area by VTI: 3.1 cm2
AV Mean Gradient: 4 mm[Hg]
AV Mean Velocity: 1 m/s
AV Peak Gradient: 9 mm[Hg]
AV Peak Velocity: 1.5 m/s
AV VTI: 29.6 cm
AV Velocity Ratio: 0.8
AVA/BSA Peak Velocity: 1.8 cm2/m2
AVA/BSA VTI: 1.9 cm2/m2
Ao Root Index: 1.75 cm/m2
Aortic Root: 2.8 cm
Ascending Aorta Index: 2.06 cm/m2
Ascending Aorta: 3.3 cm
Body Surface Area: 1.65 m2
E/E' Lateral: 12.2
E/E' Ratio (Averaged): 13.36
E/E' Septal: 14.52
EF Physician: 70 %
Est. RA Pressure: 3 mm[Hg]
Fractional Shortening 2D: 34 % (ref 28–44)
IVC Expiration: 1.2 cm
IVC Inspiration: 0.6 cm
IVSd: 1.1 cm — AB (ref 0.6–0.9)
LA Area 2C: 16 cm2
LA Area 4C: 17.7 cm2
LA Diameter: 3.1 cm
LA Major Axis: 4.9 cm
LA Minor Axis: 5.1 cm
LA Size Index: 1.94 cm/m2
LA Volume BP: 47 mL (ref 22–52)
LA Volume Index BP: 29 mL/m2 (ref 16–34)
LA Volume Index MOD A2C: 26 mL/m2 (ref 16–34)
LA Volume Index MOD A4C: 33 mL/m2 (ref 16–34)
LA Volume MOD A2C: 41 mL (ref 22–52)
LA Volume MOD A4C: 52 mL (ref 22–52)
LA/AO Root Ratio: 1.11
LV E' Lateral Velocity: 5 cm/s
LV E' Septal Velocity: 4.2 cm/s
LV Mass 2D Index: 94.6 g/m2 (ref 43–95)
LV Mass 2D: 151.3 g (ref 67–162)
LV RWT Ratio: 0.54
LVIDd Index: 2.56 cm/m2
LVIDd: 4.1 cm (ref 3.9–5.3)
LVIDs Index: 1.69 cm/m2
LVIDs: 2.7 cm
LVOT Area: 3.5 cm2
LVOT Diameter: 2.1 cm
LVOT Mean Gradient: 3 mm[Hg]
LVOT Peak Gradient: 6 mm[Hg]
LVOT Peak Velocity: 1.2 m/s
LVOT SV: 92.4 mL
LVOT Stroke Volume Index: 57.8 mL/m2
LVOT VTI: 26.7 cm
LVOT:AV VTI Index: 0.9
LVPWd: 1.1 cm — AB (ref 0.6–0.9)
MV A Velocity: 0.77 m/s
MV E Velocity: 0.61 m/s
MV E Wave Deceleration Time: 201 ms
MV E/A: 0.79
RV Basal Dimension: 3.1 cm
RV Free Wall Peak S': 10.8 cm/s
RVSP: 23 mm[Hg]
TR Max Velocity: 2.24 m/s
TR Peak Gradient: 20 mm[Hg]

## 2022-11-25 ENCOUNTER — Ambulatory Visit: Admit: 2022-11-25 | Discharge: 2022-11-25 | Payer: MEDICARE | Attending: Internal Medicine | Primary: Internal Medicine

## 2022-11-25 DIAGNOSIS — F419 Anxiety disorder, unspecified: Secondary | ICD-10-CM

## 2022-11-25 MED ORDER — DULOXETINE HCL 60 MG PO CPEP
60 | ORAL_CAPSULE | Freq: Every day | ORAL | 3 refills | 30.00000 days | Status: DC
Start: 2022-11-25 — End: 2023-12-24

## 2022-11-25 MED ORDER — FUROSEMIDE 20 MG PO TABS
20 | ORAL_TABLET | ORAL | 3 refills | Status: DC
Start: 2022-11-25 — End: 2024-01-21

## 2022-11-25 NOTE — Progress Notes (Signed)
[facility]     Date of Visit:  11/25/2022  Patient Name: Amy Arellano   Patient DOB:  September 30, 1945     CHIEF COMPLAINT:     Amy Arellano is a 77 y.o. female who presents today for an general visit to be evaluated for the following condition(s):  Chief Complaint   Patient presents with    Anemia    1 Month Follow-Up       HISTORY OF PRESENT ILLNESS      Patient was seen in [facility] clinic yesterday by Dr. 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  They are recommending that she undergo percutaneous ablation of the liver.  She is scheduled to see Dr. Ardeen Jourdain on Friday.  Her colonoscopy will be rescheduled.    Hemoglobin September 16 was 9.9.  Remained stable on October 10 At 9.9.  [facility] asked her to stop iron tablets.    Chest x-ray no acute findings.  Iron level 374.  Echocardiogram October 24 with ejection fraction excellent at 65 to 70%.    Taking all other meds    REVIEW OF SYSTEMS     Review of Systems   Constitutional:  Negative for chills, fever and unexpected weight change.   HENT:  Negative for congestion, ear pain, hearing loss, rhinorrhea, sinus pressure, sinus pain, sneezing and sore throat.    Eyes:  Negative for pain, redness and visual disturbance.   Respiratory:  Negative for cough, shortness of breath and wheezing.    Cardiovascular:  Negative for chest pain, palpitations and leg swelling.   Gastrointestinal:  Negative for abdominal pain, blood in stool, constipation, diarrhea, nausea and vomiting.   Endocrine: Negative for cold intolerance and heat intolerance.   Genitourinary:  Negative for difficulty urinating, dysuria, frequency, hematuria and urgency.   Musculoskeletal:  Negative for arthralgias, back pain, gait problem, joint swelling, myalgias and neck pain.   Skin:  Negative for rash and wound.   Allergic/Immunologic: Negative for immunocompromised state.   Neurological:  Negative for dizziness, tremors, seizures, syncope, speech difficulty, weakness,  light-headedness, numbness and headaches.   Psychiatric/Behavioral:  Negative for confusion, hallucinations and sleep disturbance. The patient is not nervous/anxious.         REVIEWED INFORMATION      Current Outpatient Medications   Medication Sig Dispense Refill    glimepiride (AMARYL) 2 MG tablet Take 1 tablet by mouth every morning (before breakfast) 1 p.o q a.m. and 1/2 p.o q p.m.      ALPRAZolam (XANAX) 0.25 MG tablet Take 1 tablet by mouth daily as needed for Anxiety for up to 30 days. Max Daily Amount: 0.25 mg 30 tablet 0    metFORMIN (GLUCOPHAGE) 1000 MG tablet Take 1 tablet by mouth 2 times daily 180 tablet 3    Continuous Glucose Sensor (DEXCOM G7 SENSOR) MISC as directed change snesor every 10 days for 30      vitamin B-12 (CYANOCOBALAMIN) 1000 MCG tablet Take 1 tablet by mouth daily      amLODIPine (NORVASC) 5 MG tablet Take 1 tablet by mouth daily 90 tablet 3    spironolactone (ALDACTONE) 50 MG tablet Take 1 tablet by mouth every other day 45 tablet 3    furosemide (LASIX) 20 MG tablet Take 1 tablet by mouth every other day 45 tablet 2    magnesium Oxide 500 MG TABS Take 1.5 tablets by mouth daily      omeprazole (PRILOSEC) 20 MG delayed release capsule Take  1 capsule by mouth daily      DULoxetine (CYMBALTA) 60 MG extended release capsule Take 1 capsule by mouth daily 90 capsule 3    Cholecalciferol 50 MCG (2000 UT) TABS Take 1 tablet by mouth daily       No current facility-administered medications for this visit.        Allergies   Allergen Reactions    Oxycodone Itching    Rocephin [Ceftriaxone] Rash     Developed a diffuse, widespread maculopapular rash 1 day after starting ceftriaxone with minimal urticaria/edema.       Patient Active Problem List   Diagnosis    Hypomagnesemia    Type 2 diabetes mellitus without complication, without long-term current use of insulin (HCC)    Intrahepatic cholangiocarcinoma (HCC)    Bladder cancer (HCC)    Hypokalemia    Mixed hyperlipidemia    Gastroesophageal  reflux disease without esophagitis    Essential hypertension    Moderate episode of recurrent major depressive disorder (HCC)    Major depressive disorder, recurrent, moderate    Anxiety    Nonrheumatic pulmonary valve insufficiency    Mitral valve annular calcification    Vitamin B12 deficiency    Iron deficiency anemia       Past Medical History:   Diagnosis Date    Cancer (HCC)     Diabetes mellitus (HCC)        Past Surgical History:   Procedure Laterality Date    CARPAL TUNNEL RELEASE Bilateral     CHOLECYSTECTOMY  04/2016    [facility]    COLONOSCOPY  04/2016    [facility] [facility]    CYSTOSCOPY  04/2022    Dr. Dorthea Cove at [facility]    LIVER RESECTION  04/2016    [facility]    THORACENTESIS  04/2016    TRANSURETHRAL RESECTION OF BLADDER TUMOR  01/2016    [facility]        Social History     Socioeconomic History    Marital status: Married     Spouse name: None    Number of children: None    Years of education: None    Highest education level: None   Tobacco Use    Smoking status: Former     Current packs/day: 0.00     Average packs/day: 1 pack/day for 26.0 years (26.0 ttl pk-yrs)     Types: Cigarettes     Start date: 01/27/1967     Quit date: 01/26/1993     Years since quitting: 29.8    Smokeless tobacco: Never   Vaping Use    Vaping status: Never Used   Substance and Sexual Activity    Alcohol use: Not Currently    Drug use: Never     Social Determinants of Health     Financial Resource Strain: Low Risk  (12/25/2021)    Overall Financial Resource Strain (CARDIA)     Difficulty of Paying Living Expenses: Not hard at all   Food Insecurity: No Food Insecurity (12/25/2021)    Hunger Vital Sign     Worried About Running Out of Food in the Last Year: Never true     Ran Out of Food in the Last Year: Never true   Transportation Needs: Unknown (12/25/2021)    PRAPARE - Transportation     Lack of Transportation (Non-Medical): No   Housing Stability: Unknown (12/25/2021)    Housing  Stability Vital Sign     Unstable  Housing in the Last Year: No        Family History   Problem Relation Age of Onset    Stroke Mother 66    Heart Attack Mother     Melanoma Father     Breast Cancer Sister     Breast Cancer Sister         PHYSICAL EXAM     BP 120/76 (Site: Left Upper Arm, Position: Sitting, Cuff Size: Medium Adult)   Pulse 96   Temp 97.2 F (36.2 C) (Temporal)   Ht 1.486 m (4' 10.5")   Wt 65.3 kg (144 lb)   SpO2 97%   BMI 29.58 kg/m    Physical Exam  Vitals reviewed.   Constitutional:       General: She is not in acute distress.     Appearance: Normal appearance.   HENT:      Head: Normocephalic and atraumatic.      Right Ear: Tympanic membrane and external ear normal.      Left Ear: Tympanic membrane and external ear normal.      Nose: Nose normal.      Mouth/Throat:      Mouth: Mucous membranes are moist.      Pharynx: No oropharyngeal exudate or posterior oropharyngeal erythema.   Eyes:      Extraocular Movements: Extraocular movements intact.      Conjunctiva/sclera: Conjunctivae normal.      Pupils: Pupils are equal, round, and reactive to light.   Neck:      Vascular: No carotid bruit.   Cardiovascular:      Rate and Rhythm: Normal rate and regular rhythm.      Pulses: Normal pulses.      Heart sounds: No murmur heard.     No friction rub. No gallop.   Pulmonary:      Effort: Pulmonary effort is normal. No respiratory distress.      Breath sounds: Normal breath sounds. No wheezing, rhonchi or rales.   Abdominal:      General: Bowel sounds are normal. There is no distension.      Palpations: Abdomen is soft. There is no mass.      Tenderness: There is no abdominal tenderness. There is no guarding.   Musculoskeletal:         General: No swelling or deformity. Normal range of motion.      Cervical back: Normal range of motion and neck supple. No rigidity.   Lymphadenopathy:      Cervical: No cervical adenopathy.   Skin:     General: Skin is warm.      Coloration: Skin is not jaundiced or  pale.      Findings: No bruising or rash.   Neurological:      General: No focal deficit present.      Mental Status: She is alert and oriented to person, place, and time.      Cranial Nerves: No cranial nerve deficit.      Motor: No weakness.      Gait: Gait normal.      Deep Tendon Reflexes: Reflexes normal.   Psychiatric:         Mood and Affect: Mood normal.         Thought Content: Thought content normal.         The 10-year ASCVD risk score (Arnett DK, et al., 2019) is: 32.6%    Values used to calculate the score:      Age:  77 years      Sex: Female      Is Non-Hispanic African American: No      Diabetic: Yes      Tobacco smoker: No      Systolic Blood Pressure: 120 mmHg      Is BP treated: Yes      HDL Cholesterol: 51 mg/dL      Total Cholesterol: 165 mg/dL     Results for orders placed or performed during the hospital encounter of 11/19/22   Echo (TTE) complete (PRN contrast/bubble/strain/3D)   Result Value Ref Range    LA Minor Axis 5.1 cm    LA Major Axis 4.9 cm    LA Area 2C 16.0 cm2    LA Area 4C 17.7 cm2    LA Volume MOD A2C 41 22 - 52 mL    LA Volume MOD A4C 52 22 - 52 mL    LA Volume BP 47 22 - 52 mL    LA Diameter 3.1 cm    AV Mean Gradient 4 mmHg    AV VTI 29.6 cm    AV Mean Velocity 1.0 m/s    AV Peak Velocity 1.5 m/s    AV Peak Gradient 9 mmHg    AV Area by VTI 3.1 cm2    AV Area by Peak Velocity 2.9 cm2    Aortic Root 2.8 cm    Ascending Aorta 3.3 cm    IVC Expiration 1.2 cm    IVC Inspiration 0.6 cm    IVSd 1.1 (A) 0.6 - 0.9 cm    LVIDd 4.1 3.9 - 5.3 cm    LVIDs 2.7 cm    LVOT Diameter 2.1 cm    LVOT Mean Gradient 3 mmHg    LVOT VTI 26.7 cm    LVOT Peak Velocity 1.2 m/s    LVOT Peak Gradient 6 mmHg    LVPWd 1.1 (A) 0.6 - 0.9 cm    LV E' Lateral Velocity 5.0 cm/s    LV E' Septal Velocity 4.2 cm/s    LVOT Area 3.5 cm2    LVOT SV 92.4 ml    MV E Wave Deceleration Time 201.0 ms    MV A Velocity 0.77 m/s    MV E Velocity 0.61 m/s    RV Basal Dimension 3.1 cm    RV Free Wall Peak S' 10.8 cm/s    TR  Max Velocity 2.24 m/s    TR Peak Gradient 20 mmHg    Body Surface Area 1.65 m2    Fractional Shortening 2D 34 28 - 44 %    LVIDd Index 2.56 cm/m2    LVIDs Index 1.69 cm/m2    LV RWT Ratio 0.54     LV Mass 2D 151.3 67 - 162 g    LV Mass 2D Index 94.6 43 - 95 g/m2    MV E/A 0.79     E/E' Ratio (Averaged) 13.36     E/E' Lateral 12.20     E/E' Septal 14.52     LA Volume Index BP 29 16 - 34 ml/m2    LVOT Stroke Volume Index 57.8 mL/m2    LA Volume Index MOD A2C 26 16 - 34 ml/m2    LA Volume Index MOD A4C 33 16 - 34 ml/m2    LA Size Index 1.94 cm/m2    LA/AO Root Ratio 1.11     Ao Root Index 1.75 cm/m2    Ascending Aorta Index 2.06 cm/m2  AV Velocity Ratio 0.80     LVOT:AV VTI Index 0.90     AVA/BSA VTI 1.9 cm2/m2    AVA/BSA Peak Velocity 1.8 cm2/m2    Est. RA Pressure 3 mmHg    RVSP 23 mmHg    EF Physician 70 %      ASSESSMENT/PLAN     1. Anxiety  Comments:  Stable on Cymbalta 60 mg daily.  Xanax 0.25 mg on a rare basis.  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  Staying active.  2. Major depressive disorder, recurrent, moderate (HCC)  Comments:  Stable on Cymbalta 60 mg daily. . Counseling in [place]. Staying active. main stressor is her husband  3. Type 2 diabetes mellitus without complication, without long-term current use of insulin (HCC)  Comments:  Glimepiride 2 mg every morning and 1/2 tablet in the evening.  Metformin 1000 mg twice daily.  Home blood sugar stable.  Off pioglitazone  4. Intrahepatic cholangiocarcinoma (HCC)  Comments:  Plans for percutaneous ablation of the liver.  Appointment with Dr. Ardeen Jourdain at [facility] clinic on Friday.  5. Essential hypertension  Comments:  Blood pressure stable on amlodipine 5 mg daily, Lasix 20 mg every other day alternating with spironolactone 50 mg every other day. NAS  The following orders have not been finalized:  Orders:  -     furosemide (LASIX) 20 MG tablet  6. Malignant neoplasm of urinary bladder, unspecified site [facility])  Comments:  Cystoscopy April 2024 per urologist at [facility]  clinic without evidence of recurrence.  Off pioglitazone.  7. Mixed hyperlipidemia  Comments:  Last lipids November 2023 excellent.  Off statin for years.  Low-cholesterol diet  8. Hypomagnesemia  Comments:  Magnesium 400 mg twice daily.  No diarrheal symptoms.  Monitor levels  9. Colon cancer screening  Comments:  Colonoscopy rescheduled.  10. Vitamin B12 deficiency  Comments:  Vitamin B12 1000 mcg daily.  Last level therapeutic.  11. Immunization due  Comments:  recommend flu, covid, and RSV vaccines  12. Moderate episode of recurrent major depressive disorder (HCC)  The following orders have not been finalized:  -     DULoxetine (CYMBALTA) 60 MG extended release capsule  13. Essential hypertension  The following orders have not been finalized:  -     furosemide (LASIX) 20 MG tablet  14. Hearing aid worn  Comments:  bilateral hearing aids       Return in about 3 months (around 02/25/2023).    COMMUNICATION:       Electronically signed by Adalberto Cole, MD on 11/25/2022 at 3:33 PM

## 2023-01-07 NOTE — Telephone Encounter (Signed)
Patient is having a Colonoscopy on 01/13/23 at [facility]. Surgeon's office wanted patient to touch base about her diabetes and what she should do prior to the surgery. Patient received prep. Please Advise.

## 2023-01-10 NOTE — Telephone Encounter (Signed)
 Hold Glimepiride and metformin morning dose

## 2023-01-12 NOTE — Telephone Encounter (Signed)
 I informed the patient of Dr Paat's response.Patient states that she takes both of those medications in the evening.

## 2023-01-18 ENCOUNTER — Encounter: Admit: 2023-01-18 | Admitting: Internal Medicine

## 2023-01-18 DIAGNOSIS — E119 Type 2 diabetes mellitus without complications: Secondary | ICD-10-CM

## 2023-01-18 MED ORDER — GLIMEPIRIDE 2 MG PO TABS
2 | ORAL_TABLET | Freq: Two times a day (BID) | ORAL | 3 refills | Status: AC
Start: 2023-01-18 — End: 2024-01-10

## 2023-01-18 NOTE — Telephone Encounter (Signed)
 Amy Arellano is calling to request a refill on the following medication(s):    Last Visit Date (If Applicable):  11/25/2022    Next Visit Date:    03/02/2023    Medication Request:  Requested Prescriptions     Pending Prescriptions Disp Refills    glime

## 2023-01-25 NOTE — Telephone Encounter (Signed)
 We received a request for last office note from [employer] regarding CGM supplies.    I spoke with patient and she does receive her supplies from them.Molli Knock to send notes.

## 2023-02-24 ENCOUNTER — Telehealth

## 2023-02-24 NOTE — Telephone Encounter (Signed)
I informed patient of labs ordered.

## 2023-02-24 NOTE — Telephone Encounter (Signed)
Patient would like to know if she needs an A1C before her next appointment on 03/02/23. Patient had labs drawn at [facility] on 02/18/23.  Please call the patient back.

## 2023-02-26 ENCOUNTER — Inpatient Hospital Stay: Payer: MEDICARE | Primary: Internal Medicine

## 2023-02-26 DIAGNOSIS — E782 Mixed hyperlipidemia: Secondary | ICD-10-CM

## 2023-02-26 LAB — LIPID PANEL
Chol/HDL Ratio: 3.2
Cholesterol, Total: 138 mg/dL (ref 0–199)
HDL: 43 mg/dL (ref >40)
LDL Cholesterol: 68 mg/dL (ref 0–100)
Triglycerides: 134 mg/dL (ref <150)
VLDL: 27 mg/dL (ref 1–30)

## 2023-02-26 LAB — ALBUMIN/CREATININE RATIO, URINE
Albumin Urine: 21 mg/L — ABNORMAL HIGH (ref 0–20)
Creatinine, Ur: 76 mg/dL (ref 28.0–217.0)
Microalb/Crt. Ratio: 28 ug/mg{creat} — ABNORMAL HIGH (ref 0.0–25.0)

## 2023-02-26 LAB — HEMOGLOBIN A1C
Estimated Avg Glucose: 154 mg/dL
Hemoglobin A1C: 7 % — ABNORMAL HIGH (ref 4.0–6.0)

## 2023-03-02 ENCOUNTER — Inpatient Hospital Stay: Payer: MEDICARE | Primary: Internal Medicine

## 2023-03-02 ENCOUNTER — Ambulatory Visit
Admit: 2023-03-02 | Discharge: 2023-03-02 | Payer: MEDICARE | Attending: Physician Assistant | Primary: Physician Assistant

## 2023-03-02 ENCOUNTER — Inpatient Hospital Stay: Admit: 2023-03-02 | Payer: MEDICARE | Primary: Internal Medicine

## 2023-03-02 VITALS — BP 130/80 | HR 90 | Temp 96.80000°F | Ht 58.5 in | Wt 142.0 lb

## 2023-03-02 DIAGNOSIS — Z0000 Encounter for general adult medical examination without abnormal findings: Secondary | ICD-10-CM

## 2023-03-02 DIAGNOSIS — M25552 Pain in left hip: Secondary | ICD-10-CM

## 2023-03-02 MED ORDER — ALPRAZOLAM 0.25 MG PO TABS
0.25 | ORAL_TABLET | Freq: Every day | ORAL | 0 refills | Status: DC | PRN
Start: 2023-03-02 — End: 2023-10-18

## 2023-03-02 NOTE — Patient Instructions (Signed)
Learning About Mindfulness for Stress  What are mindfulness and stress?     Stress is your body's response to a hard situation. Your body can have a physical, emotional, or mental response. A lot of things can cause stress. You may feel stress when you go on a job interview, take a test, or run a race. This kind of short-term stress is normal and even useful. It can help you if you need to work hard or react quickly.  Stress also can last a long time. Long-term stress is caused by stressful situations or events. Examples of long-term stress include long-term health problems, ongoing problems at work, and conflicts in your family. Long-term stress can harm your health.  Mindfulness is a focus only on things happening in the present moment. It's a process of purposefully paying attention to and being aware of your surroundings, your emotions, your thoughts, and how your body feels. You are aware of these things, but you aren't judging these experiences as "good" or "bad." Mindfulness can help you learn to calm your mind and body to help you cope with illness, pain, and stress.  How does mindfulness help to relieve stress?  Mindfulness can help quiet your mind and relax your body. Studies show that it can help some people sleep better, feel less anxious, and bring their blood pressure down. And it's been shown to help some people live and cope better with certain health problems like heart disease, depression, chronic pain, and cancer.  How do you practice mindfulness?  To be mindful is to pay attention, to be present, and to be accepting. Like any new skill or habit, being mindful can take practice.  When you're mindful, you do just one thing and you pay close attention to that one thing. For example, you may sit quietly and notice your emotions or how your food tastes and smells.  When you're present, you focus on the things that are happening right now. You let go of your thoughts about the past and the future.  When you dwell on the past or the future, you miss moments that can heal and strengthen you. You may miss moments like hearing a child laugh or seeing a friendly face when you think you're all alone.  When you're accepting, you don't judge the present moment. Instead you accept your thoughts and feelings as they come.  You can practice anytime, anywhere, and in any way you choose. You can practice in many ways. Here are a few ideas:  While doing your chores, like washing the dishes, let your mind focus on what's in your hand. What does the dish feel like? Is the water warm or cold?  Go outside and take a few deep breaths. What is the air like? Is it warm or cold?  When you can, take some time at the start of your day to sit alone and think.  Take a slow walk by yourself. Count your steps while you breathe in and out.  Try yoga breathing exercises, stretches, and poses to strengthen and relax your muscles.  At work, if you can, try to stop for a few moments each hour. Note how your body feels. Let yourself regroup and let your mind settle before you return to what you were doing.  If you struggle with anxiety or "worry thoughts," imagine your mind as a blue sky and your worry thoughts as clouds. Now imagine those worry thoughts floating across your mind's sky. Just let them pass  by as you watch.  Follow-up care is a key part of your treatment and safety. Be sure to make and go to all appointments, and call your doctor if you are having problems. It's also a good idea to know your test results and keep a list of the medicines you take.  Where can you learn more?  Go to [employer] and enter M676 to learn more about "Learning About Mindfulness for Stress."  Current as of: August 26, 2022  Content Version: 14.3   [address], [place].   Care instructions adapted under license by [facility]. If you have questions about a medical condition or this instruction, always ask your healthcare  professional. Larene Beach, [facility], disclaims any warranty or liability for your use of this information.         Learning About Stress  What is stress?     Stress is your body's response to a hard situation. Your body can have a physical, emotional, or mental response. Stress is a fact of life for most people, and it affects everyone differently. What causes stress for you may not be stressful for someone else.  A lot of things can cause stress. You may feel stress when you go on a job interview, take a test, or run a race. This kind of short-term stress is normal and even useful. It can help you if you need to work hard or react quickly. For example, stress can help you finish an important job on time.  Long-term stress is caused by ongoing stressful situations or events. Examples of long-term stress include long-term health problems, ongoing problems at work, or conflicts in your family. Long-term stress can harm your health.  How does stress affect your health?  When you are stressed, your body responds as though you are in danger. It makes hormones that speed up your heart, make you breathe faster, and give you a burst of energy. This is called the fight-or-flight stress response. If the stress is over quickly, your body goes back to normal and no harm is done.  But if stress happens too often or lasts too long, it can have bad effects. Long-term stress can make you more likely to get sick, and it can make symptoms of some diseases worse. If you tense up when you are stressed, you may develop neck, shoulder, or low back pain. Stress is linked to high blood pressure and heart disease.  Stress also harms your emotional health. It can make you moody, tense, or depressed. Your relationships may suffer, and you may not do well at work or school.  What can you do to manage stress?  You can try these things to help manage stress:   Do something active. Exercise or activity can help reduce stress. Walking is a great  way to get started. Even everyday activities such as housecleaning or yard work can help.  Try yoga or tai chi. These techniques combine exercise and meditation. You may need some training at first to learn them.  Do something you enjoy. For example, listen to music or go to a movie. Practice your hobby or do volunteer work.  Meditate. This can help you relax, because you are not worrying about what happened before or what may happen in the future.  Do guided imagery. Imagine yourself in any setting that helps you feel calm. You can use online videos, books, or a teacher to guide you.  Do breathing exercises. For example:  From a standing  position, bend forward from the waist with your knees slightly bent. Let your arms dangle close to the floor.  Breathe in slowly and deeply as you return to a standing position. Roll up slowly and lift your head last.  Hold your breath for just a few seconds in the standing position.  Breathe out slowly and bend forward from the waist.  Let your feelings out. Talk, laugh, cry, and express anger when you need to. Talking with supportive friends or family, a Veterinary surgeon, or a faith leader about your feelings is a healthy way to relieve stress. Avoid discussing your feelings with people who make you feel worse.  Write. It may help to write about things that are bothering you. This helps you find out how much stress you feel and what is causing it. When you know this, you can find better ways to cope.  What can you do to prevent stress?  You might try some of these things to help prevent stress:  Manage your time. This helps you find time to do the things you want and need to do.  Get enough sleep. Your body recovers from the stresses of the day while you are sleeping.  Get support. Your family, friends, and community can make a difference in how you experience stress.  Limit your news feed. Avoid or limit time on social media or news that may make you feel stressed.  Do something  active. Exercise or activity can help reduce stress. Walking is a great way to get started.  Where can you learn more?  Go to [employer] and enter N032 to learn more about "Learning About Stress."  Current as of: November 18, 2021  Content Version: 14.3   [address] [place].   Care instructions adapted under license by [facility]. If you have questions about a medical condition or this instruction, always ask your healthcare professional. Larene Beach, [facility], disclaims any warranty or liability for your use of this information.         Learning About Being Active as an Older Adult  Why is being active important as you get older?     Being active is one of the best things you can do for your health. And it's never too late to start. Being active--or getting active, if you aren't already--has definite benefits. It can:  Give you more energy,  Keep your mind sharp.  Improve balance to reduce your risk of falls.  Help you manage chronic illness with fewer medicines.  No matter how old you are, how fit you are, or what health problems you have, there is a form of activity that will work for you. And the more physical activity you can do, the better your overall health will be.  What kinds of activity can help you stay healthy?  Being more active will make your daily activities easier. Physical activity includes planned exercise and things you do in daily life. There are four types of activity:  Aerobic.  Doing aerobic activity makes your heart and lungs strong.  Includes walking, dancing, and gardening.  Aim for at least 2 hours spread throughout the week.  It improves your energy and can help you sleep better.  Muscle-strengthening.  This type of activity can help maintain muscle and strengthen bones.  Includes climbing stairs, using resistance bands, and lifting or carrying heavy loads.  Aim for at least twice a week.  It can help protect the knees and other  joints.  Stretching.  Stretching gives you better range of motion in joints and muscles.  Includes upper arm stretches, calf stretches, and gentle yoga.  Aim for at least twice a week, preferably after your muscles are warmed up from other activities.  It can help you function better in daily life.  Balancing.  This helps you stay coordinated and have good posture.  Includes heel-to-toe walking, tai chi, and certain types of yoga.  Aim for at least 3 days a week.  It can reduce your risk of falling.  Even if you have a hard time meeting the recommendations, it's better to be more active than less active. All activity done in each category counts toward your weekly total. You'd be surprised how daily things like carrying groceries, keeping up with grandchildren, and taking the stairs can add up.  What keeps you from being active?  If you've had a hard time being more active, you're not alone. Maybe you remember being able to do more. Or maybe you've never thought of yourself as being active. It's frustrating when you can't do the things you want. Being more active can help. What's holding you back?  Getting started.  Have a goal, but break it into easy tasks. Small steps build into big accomplishments.  Staying motivated.  If you feel like skipping your activity, remember your goal. Maybe you want to move better and stay independent. Every activity gets you one step closer.  Not feeling your best.  Start with 5 minutes of an activity you enjoy. Prove to yourself you can do it. As you get comfortable, increase your time.  You may not be where you want to be. But you're in the process of getting there. Everyone starts somewhere.  How can you find safe ways to stay active?  Talk with your doctor about any physical challenges you're facing. Make a plan with your doctor if you have a health problem or aren't sure how to get started with activity.  If you're already active, ask your doctor if there is anything you should  change to stay safe as your body and health change.  If you tend to feel dizzy after you take medicine, avoid activity at that time. Try being active before you take your medicine. This will reduce your risk of falls.  If you plan to be active at home, make sure to clear your space before you get started. Remove things like TV cords, coffee tables, and throw rugs. It's safest to have plenty of space to move freely.  The key to getting more active is to take it slow and steady. Try to improve only a little bit at a time. Pick just one area to improve on at first. And if an activity hurts, stop and talk to your doctor.  Where can you learn more?  Go to [employer] and enter P600 to learn more about "Learning About Being Active as an Older Adult."  Current as of: August 26, 2022  Content Version: 14.3   [address], [place].   Care instructions adapted under license by [facility]. If you have questions about a medical condition or this instruction, always ask your healthcare professional. Larene Beach, [facility], disclaims any warranty or liability for your use of this information.         Learning About Vision Tests  What are vision tests?     The four most common vision tests are visual acuity tests, refraction, visual field tests, and color vision tests.  Visual  acuity (sharpness) tests  These tests are used:  To see if you need glasses or contact lenses.  To monitor an eye problem.  To check an eye injury.  Visual acuity tests are done as part of routine exams. You may also have this test when you get your driver's license or apply for some types of jobs.  Visual field tests  These tests are used:  To check for vision loss in any area of your range of vision.  To screen for certain eye diseases.  To look for nerve damage after a stroke, head injury, or other problem that could reduce blood flow to the brain.  Refraction and color tests  A refraction test is done to find the right  prescription for glasses and contact lenses.  A color vision test is done to check for color blindness.  Color vision is often tested as part of a routine exam. You may also have this test when you apply for a job where recognizing different colors is important, such as truck driving, Optician, dispensing, or the [employer].  How are vision tests done?  Visual acuity test   You cover one eye at a time.  You read aloud from a wall chart across the room.  You read aloud from a small card that you hold in your hand.  Refraction   You look into a special device.  The device puts lenses of different strengths in front of each eye to see how strong your glasses or contact lenses need to be.  Visual field tests   Your doctor may have you look through special machines.  Or your doctor may simply have you stare straight ahead while they move a finger into and out of your field of vision.  Color vision test   You look at pieces of printed test patterns in various colors. You say what number or symbol you see.  Your doctor may have you trace the number or symbol using a pointer.  How do these tests feel?  There is very little chance of having a problem from this test. If dilating drops are used for a vision test, they may make the eyes sting and cause a medicine taste in the mouth.  Follow-up care is a key part of your treatment and safety. Be sure to make and go to all appointments, and call your doctor if you are having problems. It's also a good idea to know your test results and keep a list of the medicines you take.  Where can you learn more?  Go to [employer] and enter G551 to learn more about "Learning About Vision Tests."  Current as of: August 26, 2022  Content Version: 14.3   [address] [place].   Care instructions adapted under license by [facility]. If you have questions about a medical condition or this instruction, always ask your healthcare professional. Larene Beach, [facility], disclaims  any warranty or liability for your use of this information.         A Healthy Heart: Care Instructions  Overview     Coronary artery disease, also called heart disease, occurs when a substance called plaque builds up in the vessels that supply oxygen-rich blood to your heart muscle. This can narrow the blood vessels and reduce blood flow. A heart attack happens when blood flow is completely blocked. A high-fat diet, smoking, and other factors increase the risk of heart disease.  Your doctor has found that you have a chance of  having heart disease. A heart-healthy lifestyle can help keep your heart healthy and prevent heart disease. This lifestyle includes eating healthy, being active, staying at a weight that's healthy for you, and not smoking or using tobacco. It also includes taking medicines as directed, managing other health conditions, and trying to get a healthy amount of sleep.  Follow-up care is a key part of your treatment and safety. Be sure to make and go to all appointments, and call your doctor if you are having problems. It's also a good idea to know your test results and keep a list of the medicines you take.  How can you care for yourself at home?  Diet    Use less salt when you cook and eat. This helps lower your blood pressure. Taste food before salting. Add only a little salt when you think you need it. With time, your taste buds will adjust to less salt.     Eat fewer snack items, fast foods, canned soups, and other high-salt, high-fat, processed foods.     Read food labels and try to avoid saturated and trans fats. They increase your risk of heart disease by raising cholesterol levels.     Limit the amount of solid fat--butter, margarine, and shortening--you eat. Use olive, peanut, or canola oil when you cook. Bake, broil, and steam foods instead of frying them.     Eat a variety of fruit and vegetables every day. Dark green, deep orange, red, or yellow fruits and vegetables are especially good  for you. Examples include spinach, carrots, peaches, and berries.     Foods high in fiber can reduce your cholesterol and provide important vitamins and minerals. High-fiber foods include whole-grain cereals and breads, oatmeal, beans, brown rice, citrus fruits, and apples.     Eat lean proteins. Heart-healthy proteins include seafood, lean meats and poultry, eggs, beans, peas, nuts, seeds, and soy products.     Limit drinks and foods with added sugar. These include candy, desserts, and soda pop.   Heart-healthy lifestyle    If your doctor recommends it, get more exercise. For many people, walking is a good choice. Or you may want to swim, bike, or do other activities. Bit by bit, increase the time you're active every day. Try for at least 30 minutes on most days of the week.     Try to quit or cut back on using tobacco and other nicotine products. This includes smoking and vaping. If you need help quitting, talk to your doctor about stop-smoking programs and medicines. These can increase your chances of quitting for good. Quitting is one of the most important things you can do to protect your heart. It is never too late to quit. Try to avoid secondhand smoke too.     Stay at a weight that's healthy for you. Talk to your doctor if you need help losing weight.     Try to get 7 to 9 hours of sleep each night.     Limit alcohol to 2 drinks a day for men and 1 drink a day for women. Too much alcohol can cause health problems.     Manage other health problems such as diabetes, high blood pressure, and high cholesterol. If you think you may have a problem with alcohol or drug use, talk to your doctor.   Medicines    Take your medicines exactly as prescribed. Call your doctor if you think you are having a problem with your medicine.  If your doctor recommends aspirin, take the amount directed each day. Make sure you take aspirin and not another kind of pain reliever, such as acetaminophen (Tylenol).   When should you  call for help?   Call 911 if you have symptoms of a heart attack. These may include:    Chest pain or pressure, or a strange feeling in the chest.     Sweating.     Shortness of breath.     Pain, pressure, or a strange feeling in the back, neck, jaw, or upper belly or in one or both shoulders or arms.     Lightheadedness or sudden weakness.     A fast or irregular heartbeat.   After you call 911, the operator may tell you to chew 1 adult-strength or 2 to 4 low-dose aspirin. Wait for an ambulance. Do not try to drive yourself.  Watch closely for changes in your health, and be sure to contact your doctor if you have any problems.  Where can you learn more?  Go to [employer] and enter F075 to learn more about "A Healthy Heart: Care Instructions."  Current as of: August 26, 2022  Content Version: 14.3   [address] [place].   Care instructions adapted under license by [facility]. If you have questions about a medical condition or this instruction, always ask your healthcare professional. Larene Beach, [facility], disclaims any warranty or liability for your use of this information.    Personalized Preventive Plan for AZALIE HARBECK - 03/02/2023  Medicare offers a range of preventive health benefits. Some of the tests and screenings are paid in full while other may be subject to a deductible, co-insurance, and/or copay.  Some of these benefits include a comprehensive review of your medical history including lifestyle, illnesses that may run in your family, and various assessments and screenings as appropriate.  After reviewing your medical record and screening and assessments performed today your provider may have ordered immunizations, labs, imaging, and/or referrals for you.  A list of these orders (if applicable) as well as your Preventive Care list are included within your After Visit Summary for your review.

## 2023-03-02 NOTE — Progress Notes (Signed)
Medicare Annual Wellness Visit    Amy Arellano is here for Medicare AWV    Assessment & Plan   Medicare annual wellness visit, subsequent  Chart reviewed.  H&P done.  Medications reviewed.  Recent testing reviewed.  Discussed vaccines.  Anxiety  -     ALPRAZolam (XANAX) 0.25 MG tablet; Take 1 tablet by mouth daily as needed for Anxiety for up to 30 days. Max Daily Amount: 0.25 mg, Disp-30 tablet, R-0Normal  OARRS report done.  No red flags.  Continue Cymbalta 60 mg daily.  Refilled Xanax.  She does seem to be doing better with the medications.  Less stress lately.  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  History of surgery in the past.  She did recently have percutaneous ablation of the liver and did well with that.  Major depressive disorder, recurrent, moderate (HCC)  Stable on Cymbalta 60 mg daily  Type 2 diabetes mellitus without complication, without long-term current use of insulin (HCC)  -     HM DIABETES FOOT EXAM  Recent A1c excellent at 7.0.  Continue to monitor blood sugars closely.  Continue diabetic education.  Foot exam done that was unremarkable in the office today.  Patient will follow-up with her eye doctor as well.  Urine microalbumin also reviewed.  Continue glimepiride 2 mg in the morning and half a tablet in the evening and metformin at 1000 mg twice a day.  Encounter for screening mammogram for malignant neoplasm of breast  -     MAM DIGITAL SCREEN W OR WO CAD BILATERAL; Future  Left hip pain  -     XR HIP 2-3 VW W PELVIS LEFT; Future  Patient fell landing on her buttock a month ago.  Will get an x-ray of the left hip since it is still sore.  There is no bruising and she has good range of motion and is able to walk without difficulty.  Malignant neoplasm of urinary bladder, unspecified site [facility])  Keep follow-up with [facility], urology and oncology.  Other iron deficiency anemia  Patient had an EGD and colonoscopy in December that were  unremarkable remarkable for any reason for the anemia.  She is now on iron every day per oncology.  Mixed hyperlipidemia  Lipids indicated good control.  Continue diet.  Hypertension  Blood pressure is well-controlled with amlodipine 5 mg daily, spironolactone alternating with furosemide.  No changes.     Return in about 3 months (around 05/30/2023) for 3-4 months f/u DM.     Subjective       Patient's complete Health Risk Assessment and screening values have been reviewed and are found in Flowsheets. The following problems were reviewed today and where indicated follow up appointments were made and/or referrals ordered.    Positive Risk Factor Screenings with Interventions:      Depression:  PHQ-2 Score: 2  PHQ-9 Total Score: 7  Total Score Interpretation: 5-9 = mild depression  Interventions:  See A/P for any pertinent orders          General HRA Questions:  Select all that apply: (!) Stress  Interventions - Stress:  See A/P for plan and any pertinent orders      Inactivity:  On average, how many days per week do you engage in moderate to strenuous exercise (like a brisk walk)?: 0 days (!) Abnormal     Interventions:  See A/P for plan and any pertinent orders  Vision Screen:  Do you have difficulty driving, watching TV, or doing any of your daily activities because of your eyesight?: No  Have you had an eye exam within the past year?: (!) No  Interventions:   See A/P for any pertinent orders          Review of Systems   Constitutional:  Negative for chills, fever and unexpected weight change.   HENT:  Negative for congestion, ear pain, hearing loss, rhinorrhea, sinus pressure, sinus pain, sneezing and sore throat.    Eyes:  Negative for pain, redness and visual disturbance.   Respiratory:  Negative for cough, shortness of breath and wheezing.    Cardiovascular:  Negative for chest pain, palpitations and leg swelling.   Gastrointestinal:  Negative for abdominal pain, blood in stool, constipation, diarrhea,  nausea and vomiting.   Endocrine: Negative for cold intolerance and heat intolerance.   Genitourinary:  Negative for difficulty urinating, dysuria, frequency, hematuria and urgency.   Musculoskeletal:  Positive for arthralgias. Negative for back pain, gait problem, joint swelling, myalgias and neck pain.   Skin:  Negative for rash and wound.   Allergic/Immunologic: Negative for immunocompromised state.   Neurological:  Negative for dizziness, tremors, seizures, syncope, speech difficulty, weakness, light-headedness, numbness and headaches.   Psychiatric/Behavioral:  Negative for confusion, hallucinations and sleep disturbance. The patient is not nervous/anxious.                Objective   Vitals:    03/02/23 1053   BP: 130/80   Site: Left Upper Arm   Position: Sitting   Cuff Size: Medium Adult   Pulse: 90   Temp: 96.8 F (36 C)   TempSrc: Temporal   SpO2: 96%   Weight: 64.4 kg (142 lb)   Height: 1.486 m (4' 10.5")      Body mass index is 29.17 kg/m.      Physical Exam  Vitals reviewed.   Constitutional:       General: She is not in acute distress.     Appearance: Normal appearance.   HENT:      Head: Normocephalic and atraumatic.   Eyes:      Extraocular Movements: Extraocular movements intact.      Conjunctiva/sclera: Conjunctivae normal.      Pupils: Pupils are equal, round, and reactive to light.   Neck:      Vascular: No carotid bruit.   Cardiovascular:      Rate and Rhythm: Normal rate and regular rhythm.      Pulses: Normal pulses.      Heart sounds: No murmur heard.     No friction rub. No gallop.   Pulmonary:      Effort: Pulmonary effort is normal. No respiratory distress.      Breath sounds: Normal breath sounds. No wheezing, rhonchi or rales.   Abdominal:      General: Bowel sounds are normal. There is no distension.      Palpations: Abdomen is soft. There is no mass.      Tenderness: There is no abdominal tenderness. There is no guarding.      Hernia: A hernia (ventral hernia, nontender) is present.    Musculoskeletal:         General: No swelling or deformity. Normal range of motion.      Cervical back: Normal range of motion and neck supple. No rigidity.   Lymphadenopathy:      Cervical: No cervical adenopathy.   Skin:  General: Skin is warm.      Coloration: Skin is not jaundiced or pale.      Findings: No bruising or rash.   Neurological:      General: No focal deficit present.      Mental Status: She is alert and oriented to person, place, and time.      Cranial Nerves: No cranial nerve deficit.      Motor: No weakness.      Gait: Gait normal.      Deep Tendon Reflexes: Reflexes normal.   Psychiatric:         Mood and Affect: Mood normal.         Thought Content: Thought content normal.     Visual inspection:  Deformity/amputation: present - no  Skin lesions/pre-ulcerative calluses: absent  Edema: right- negative, left- negative    Sensory exam:  Monofilament sensation: normal  (minimum of 5 random plantar locations tested, avoiding callused areas - > 1 area with absence of sensation is + for neuropathy)    Plus at least one of the following:  Pulses: normal,   Pinprick: Intact  Proprioception: Intact  Vibration (128 Hz): N/A                 Allergies   Allergen Reactions    Oxycodone Itching    Rocephin [Ceftriaxone] Rash     Developed a diffuse, widespread maculopapular rash 1 day after starting ceftriaxone with minimal urticaria/edema.     Prior to Visit Medications    Medication Sig Taking? Authorizing Provider   ferrous sulfate (IRON 325) 325 (65 Fe) MG tablet Take 1 tablet by mouth daily (with breakfast) Yes [provider]   ALPRAZolam (XANAX) 0.25 MG tablet Take 1 tablet by mouth daily as needed for Anxiety for up to 30 days. Max Daily Amount: 0.25 mg Yes Ignacia Bayley L, PA-C   glimepiride (AMARYL) 2 MG tablet Take 1 tablet by mouth 2 times daily Yes Paat, Richard A, MD   DULoxetine (CYMBALTA) 60 MG extended release capsule Take 1 capsule by mouth daily Yes Paat, Richard A, MD    furosemide (LASIX) 20 MG tablet Take 1 tablet by mouth every other day Yes Paat, Richard A, MD   metFORMIN (GLUCOPHAGE) 1000 MG tablet Take 1 tablet by mouth 2 times daily Yes Paat, Richard A, MD   Continuous Glucose Sensor (DEXCOM G7 SENSOR) MISC as directed change snesor every 10 days for 30 Yes [provider]   vitamin B-12 (CYANOCOBALAMIN) 1000 MCG tablet Take 1 tablet by mouth daily Yes [provider]   amLODIPine (NORVASC) 5 MG tablet Take 1 tablet by mouth daily Yes Gary Fleet, PA-C   spironolactone (ALDACTONE) 50 MG tablet Take 1 tablet by mouth every other day Yes Gary Fleet, PA-C   magnesium Oxide 500 MG TABS Take 1.5 tablets by mouth daily Yes [provider]   omeprazole (PRILOSEC) 20 MG delayed release capsule Take 1 capsule by mouth daily Yes [provider]   Cholecalciferol 50 MCG (2000 UT) TABS Take 1 tablet by mouth daily Yes [provider]       CareTeam (Including outside providers/suppliers regularly involved in providing care):   Patient Care Team:  Paat, Pearletha Furl, MD as PCP - General (Internal Medicine)  Paat, Pearletha Furl, MD as PCP - Empaneled Provider  Gary Fleet, PA-C as Physician Assistant (Physician Assistant)     Recommendations for Preventive Services Due: see orders and patient instructions/AVS.  Recommended screening schedule for the next 5-10 years is provided to the patient in written form: see Patient Instructions/AVS.     Reviewed and updated this visit:  Tobacco  Allergies  Meds  Problems  Med Hx  Surg Hx  Soc Hx  Fam Hx

## 2023-05-10 ENCOUNTER — Encounter

## 2023-05-10 NOTE — Telephone Encounter (Signed)
 Amy Arellano is calling to request a refill on the following medication(s):    Last Visit Date (If Applicable):  03/02/2023    Next Visit Date:    06/01/2023    Medication Request:  Requested Prescriptions     Pending Prescriptions Disp Refills    spironolactone (ALDACTONE) 50 MG tablet 45 tablet 3     Sig: Take 1 tablet by mouth every other day

## 2023-05-11 ENCOUNTER — Ambulatory Visit
Admit: 2023-05-11 | Discharge: 2023-05-11 | Payer: MEDICARE | Attending: Physician Assistant | Primary: Physician Assistant

## 2023-05-11 VITALS — BP 138/70 | HR 102 | Temp 97.20000°F | Ht 58.5 in | Wt 143.2 lb

## 2023-05-11 DIAGNOSIS — R21 Rash and other nonspecific skin eruption: Secondary | ICD-10-CM

## 2023-05-11 MED ORDER — SPIRONOLACTONE 50 MG PO TABS
50 | ORAL_TABLET | ORAL | 3 refills | Status: AC
Start: 2023-05-11 — End: ?

## 2023-05-11 MED ORDER — CLOTRIMAZOLE 1 % EX CREA
1 | CUTANEOUS | 1 refills | Status: AC
Start: 2023-05-11 — End: 2023-05-18

## 2023-05-11 NOTE — Progress Notes (Signed)
 [facility]     Date of Visit:  05/11/2023  Patient Name: Amy Arellano   Patient DOB:  [birth year]     CHIEF COMPLAINT:     Amy Arellano is a 78 y.o. female who presents today for an general visit to be evaluated for the following condition(s):  Chief Complaint   Patient presents with    Foot Swelling     Both feet are tight and red.        HISTORY OF PRESENT ILLNESS      Accompanied by husband  Patient complains of noticing that the top of her feet have been a little bit red and feeling tight over the past several days.  She has been using a new lotion topically for the past month but otherwise no new medications.  No injuries.  No recent illness.  No fevers or chills.  No joint pains.  Not particularly itchy.    REVIEW OF SYSTEMS     Review of Systems   Constitutional:  Negative for chills, fever and unexpected weight change.   HENT:  Negative for congestion, ear pain, hearing loss, rhinorrhea, sinus pressure, sinus pain, sneezing and sore throat.    Eyes:  Negative for pain, redness and visual disturbance.   Respiratory:  Negative for cough, shortness of breath and wheezing.    Cardiovascular:  Negative for chest pain, palpitations and leg swelling.   Gastrointestinal:  Negative for abdominal pain, blood in stool, constipation, diarrhea, nausea and vomiting.   Endocrine: Negative for cold intolerance and heat intolerance.   Genitourinary:  Negative for difficulty urinating, dysuria, frequency, hematuria and urgency.   Musculoskeletal:  Negative for arthralgias, back pain, gait problem, joint swelling, myalgias and neck pain.   Skin:  Positive for color change and rash. Negative for wound.   Allergic/Immunologic: Negative for immunocompromised state.   Neurological:  Negative for dizziness, tremors, seizures, syncope, speech difficulty, weakness, light-headedness, numbness and headaches.   Psychiatric/Behavioral:  Negative for confusion, hallucinations and sleep disturbance. The patient is not  nervous/anxious.         REVIEWED INFORMATION      Current Outpatient Medications   Medication Sig Dispense Refill    spironolactone (ALDACTONE) 50 MG tablet Take 1 tablet by mouth every other day 45 tablet 3    clotrimazole (LOTRIMIN AF) 1 % cream Apply topically 2 times daily. 28 g 1    ferrous sulfate (IRON 325) 325 (65 Fe) MG tablet Take 1 tablet by mouth daily (with breakfast)      glimepiride (AMARYL) 2 MG tablet Take 1 tablet by mouth 2 times daily 180 tablet 3    DULoxetine (CYMBALTA) 60 MG extended release capsule Take 1 capsule by mouth daily 90 capsule 3    furosemide (LASIX) 20 MG tablet Take 1 tablet by mouth every other day 45 tablet 3    metFORMIN (GLUCOPHAGE) 1000 MG tablet Take 1 tablet by mouth 2 times daily 180 tablet 3    Continuous Glucose Sensor (DEXCOM G7 SENSOR) MISC as directed change snesor every 10 days for 30      vitamin B-12 (CYANOCOBALAMIN) 1000 MCG tablet Take 1 tablet by mouth daily      amLODIPine (NORVASC) 5 MG tablet Take 1 tablet by mouth daily 90 tablet 3    magnesium Oxide 500 MG TABS Take 1.5 tablets by mouth daily      omeprazole (PRILOSEC) 20 MG delayed release capsule Take 1 capsule by mouth daily  Cholecalciferol 50 MCG (2000 UT) TABS Take 1 tablet by mouth daily      ALPRAZolam (XANAX) 0.25 MG tablet Take 1 tablet by mouth daily as needed for Anxiety for up to 30 days. Max Daily Amount: 0.25 mg 30 tablet 0     No current facility-administered medications for this visit.        Allergies   Allergen Reactions    Oxycodone Itching    Rocephin [Ceftriaxone] Rash     Developed a diffuse, widespread maculopapular rash 1 day after starting ceftriaxone with minimal urticaria/edema.       Patient Active Problem List   Diagnosis    Hypomagnesemia    Type 2 diabetes mellitus without complication, without long-term current use of insulin    Intrahepatic cholangiocarcinoma (HCC)    Bladder cancer (HCC)    Hypokalemia    Mixed hyperlipidemia    Gastroesophageal reflux disease  without esophagitis    Essential hypertension    Moderate episode of recurrent major depressive disorder (HCC)    Major depressive disorder, recurrent, moderate    Anxiety    Nonrheumatic pulmonary valve insufficiency    Mitral valve annular calcification    Vitamin B12 deficiency    Iron deficiency anemia       Past Medical History:   Diagnosis Date    Cancer (HCC)     Diabetes mellitus (HCC)     Hearing loss 2000    Have hearing aids and able to hear with them       Past Surgical History:   Procedure Laterality Date    CARPAL TUNNEL RELEASE Bilateral     CHOLECYSTECTOMY  04/2016    [facility]    COLONOSCOPY  04/2016    [facility]    COLONOSCOPY  12/2022    [facility]    CYSTOSCOPY  04/2022    [facility] Dorthea Cove at [facility]    ESOPHAGOGASTRODUODENOSCOPY  12/2022    [facility]    EYE SURGERY      Cataracts removed    LIVER RESECTION  04/2016    [facility]    THORACENTESIS  04/2016    TRANSURETHRAL RESECTION OF BLADDER TUMOR  01/2016     clinic for cancer    UPPER GASTROINTESTINAL ENDOSCOPY  12/2022    Normal        Social History     Socioeconomic History    Marital status: Married     Spouse name: None    Number of children: None    Years of education: None    Highest education level: None   Tobacco Use    Smoking status: Former     Current packs/day: 0.00     Average packs/day: 1 pack/day for 26.0 years (26.0 ttl pk-yrs)     Types: Cigarettes     Start date: 01/27/1967     Quit date: 01/26/1993     Years since quitting: 30.3    Smokeless tobacco: Never    Tobacco comments:     Quit in 1995   Vaping Use    Vaping status: Never Used   Substance and Sexual Activity    Alcohol use: Yes     Alcohol/week: 3.0 standard drinks of alcohol     Types: 1 Glasses of wine, 2 Cans of beer per week    Drug use: Never    Sexual activity: Not Currently     Partners: Male     Social Drivers of Health  Financial Resource Strain: Low Risk  (12/25/2021)    Overall Financial Resource Strain  (CARDIA)     Difficulty of Paying Living Expenses: Not hard at all   Food Insecurity: No Food Insecurity (03/02/2023)    Hunger Vital Sign     Worried About Running Out of Food in the Last Year: Never true     Ran Out of Food in the Last Year: Never true   Transportation Needs: No Transportation Needs (03/02/2023)    PRAPARE - Therapist, art (Medical): No     Lack of Transportation (Non-Medical): No   Physical Activity: Unknown (02/27/2023)    Exercise Vital Sign     Days of Exercise per Week: 0 days   Housing Stability: Low Risk  (03/02/2023)    Housing Stability Vital Sign     Unable to Pay for Housing in the Last Year: No     Number of Times Moved in the Last Year: 0     Homeless in the Last Year: No        Family History   Problem Relation Age of Onset    Stroke Mother 9    Heart Attack Mother     Vision Loss Mother     Melanoma Father     Cancer Father         Skin cancer and heart surgery hearing loss    Hearing Loss Father     Heart Disease Father     High Blood Pressure Father     Breast Cancer Sister     Breast Cancer Sister         Jacki Cones had breast cancer 23 years ago and reacurance on other breast in December. Stage 1 were able to do lumpectomy.        PHYSICAL EXAM     BP 138/70   Pulse (!) 102   Temp 97.2 F (36.2 C) (Temporal)   Ht 1.486 m (4' 10.5")   Wt 65 kg (143 lb 3.2 oz)   SpO2 95%   BMI 29.42 kg/m    Physical Exam  Vitals reviewed.   Constitutional:       General: She is not in acute distress.     Appearance: Normal appearance.   HENT:      Head: Normocephalic and atraumatic.   Eyes:      Extraocular Movements: Extraocular movements intact.      Conjunctiva/sclera: Conjunctivae normal.      Pupils: Pupils are equal, round, and reactive to light.   Neck:      Vascular: No carotid bruit.   Cardiovascular:      Rate and Rhythm: Normal rate and regular rhythm.      Pulses: Normal pulses.      Heart sounds: Murmur heard.      No friction rub. No gallop.   Pulmonary:       Effort: Pulmonary effort is normal. No respiratory distress.      Breath sounds: Normal breath sounds. No wheezing, rhonchi or rales.   Musculoskeletal:         General: No swelling or deformity. Normal range of motion.      Cervical back: Normal range of motion and neck supple. No rigidity.      Right lower leg: No edema.      Left lower leg: No edema.   Lymphadenopathy:      Cervical: No cervical adenopathy.   Skin:     General:  Skin is warm.      Coloration: Skin is not jaundiced or pale.      Findings: No bruising or rash.      Comments: Slight papular erythema distal portion of top of both feet   Neurological:      General: No focal deficit present.      Mental Status: She is alert and oriented to person, place, and time.      Cranial Nerves: No cranial nerve deficit.      Motor: No weakness.      Gait: Gait normal.   Psychiatric:         Mood and Affect: Mood normal.         Thought Content: Thought content normal.         The 10-year ASCVD risk score (Arnett DK, et al., 2019) is: 40.1%    Values used to calculate the score:      Age: 35 years      Sex: Female      Is Non-Hispanic African American: No      Diabetic: Yes      Tobacco smoker: No      Systolic Blood Pressure: 138 mmHg      Is BP treated: Yes      HDL Cholesterol: 43 mg/dL      Total Cholesterol: 138 mg/dL     Results for orders placed or performed during the hospital encounter of 02/26/23   Lipid Panel   Result Value Ref Range    Cholesterol, Total 138 0 - 199 mg/dL    HDL 43 >82 mg/dL    LDL Cholesterol 68 0 - 100 mg/dL    Chol/HDL Ratio 3.2     Triglycerides 134 <150 mg/dL    VLDL 27 1 - 30 mg/dL   Albumin/Creatinine Ratio, Urine   Result Value Ref Range    Albumin Urine 21 (H) 0 - 20 mg/L    Creatinine, Ur 76.0 28.0 - 217.0 mg/dL    Microalb/Crt. Ratio 28 (H) 0.0 - 25.0 mcg/mg creat   Hemoglobin A1C   Result Value Ref Range    Hemoglobin A1C 7.0 (H) 4.0 - 6.0 %    Estimated Avg Glucose 154 mg/dL      ASSESSMENT/PLAN     1. Rash  -      clotrimazole (LOTRIMIN AF) 1 % cream; Apply topically 2 times daily., Disp-28 g, R-1, Normal  Possible early fungal rash.  She can use Lotrimin cream twice a day.  Also can use [employer] powder in her shoes.  Call if symptoms change worsen or do not resolve.  2. Essential hypertension  Blood pressure stable and controlled with amlodipine 5 mg daily, Lasix 20 mg every other day alternating with spironolactone 50 mg every other day.  3. Type 2 diabetes mellitus without complication, without long-term current use of insulin  Continue using Dexcom to continuously monitor blood sugars.  She thinks her averaging around 140.  On metformin 1000 twice a day and glimepiride 2 mg twice a day    Return for keep upcoming appointment.    COMMUNICATION:       Electronically signed by Venus Ginsberg, PA-C on 05/11/2023 at 2:14 PM

## 2023-05-27 ENCOUNTER — Ambulatory Visit
Admit: 2023-05-27 | Discharge: 2023-05-27 | Payer: MEDICARE | Attending: Physician Assistant | Primary: Physician Assistant

## 2023-05-27 VITALS — BP 134/82 | HR 100 | Temp 97.20000°F | Ht 58.5 in | Wt 143.0 lb

## 2023-05-27 DIAGNOSIS — N3090 Cystitis, unspecified without hematuria: Secondary | ICD-10-CM

## 2023-05-27 LAB — POCT URINALYSIS DIPSTICK
Bilirubin, UA: NEGATIVE
Blood, UA POC: NEGATIVE
Glucose, UA POC: NEGATIVE mg/dL
Nitrite, UA: NEGATIVE
Protein, UA POC: 30 mg/dL
Spec Grav, UA: 1.01
Urobilinogen, UA: 0.2 mg/dL
pH, UA: 6

## 2023-05-27 MED ORDER — CIPROFLOXACIN HCL 500 MG PO TABS
500 | ORAL_TABLET | Freq: Two times a day (BID) | ORAL | 0 refills | 5.00000 days | Status: AC
Start: 2023-05-27 — End: 2023-06-03

## 2023-05-27 NOTE — Progress Notes (Signed)
 [facility]     Date of Visit:  05/27/2023  Patient Name: Amy Arellano   Patient DOB:  12-Oct-1945     CHIEF COMPLAINT:     Amy Arellano is a 78 y.o. female who presents today for an general visit to be evaluated for the following condition(s):  Chief Complaint   Patient presents with    Blood Sugar Problem     dizziness       HISTORY OF PRESENT ILLNESS    Accompanied by husband  Started nitrofurantoin yesterday following cystoscopy and felt nausea and dizzy achey yesterday evening about 5 hours after the procedure.  Urine was little bit dark this morning but no dysuria.  No fever but little chills.  No back pain.  No abdominal pain.  Today she feels a little bit better. slight nausea  BS 160-200 since yesterday.  She feels like it is higher than normal and for some reason is not coming down  No chest pain.  No palpitations.  No cough or upper respiratory symptoms  Feels little bit anxious about her blood sugars.  She has not taken her Xanax  since last week.    REVIEW OF SYSTEMS     Review of Systems   Constitutional:  Negative for fever and unexpected weight change.   HENT:  Negative for congestion, ear pain, hearing loss, rhinorrhea, sinus pressure, sinus pain, sneezing and sore throat.    Eyes:  Negative for pain, redness and visual disturbance.   Respiratory:  Negative for cough, shortness of breath and wheezing.    Cardiovascular:  Negative for chest pain, palpitations and leg swelling.   Gastrointestinal:  Positive for nausea. Negative for abdominal pain, blood in stool, constipation, diarrhea and vomiting.   Endocrine: Negative for cold intolerance and heat intolerance.   Genitourinary:  Negative for difficulty urinating, dysuria, frequency, hematuria and urgency.   Musculoskeletal:  Negative for arthralgias, back pain, gait problem, joint swelling, myalgias and neck pain.   Skin:  Negative for rash and wound.   Allergic/Immunologic: Negative for immunocompromised state.   Neurological:  Positive for  dizziness. Negative for tremors, seizures, syncope, speech difficulty, weakness, light-headedness, numbness and headaches.   Psychiatric/Behavioral:  Negative for confusion, hallucinations and sleep disturbance. The patient is not nervous/anxious.         REVIEWED INFORMATION      Current Outpatient Medications   Medication Sig Dispense Refill    ciprofloxacin  (CIPRO ) 500 MG tablet Take 1 tablet by mouth 2 times daily for 7 days 14 tablet 0    spironolactone  (ALDACTONE ) 50 MG tablet Take 1 tablet by mouth every other day 45 tablet 3    ferrous sulfate (IRON 325) 325 (65 Fe) MG tablet Take 1 tablet by mouth daily (with breakfast)      ALPRAZolam  (XANAX ) 0.25 MG tablet Take 1 tablet by mouth daily as needed for Anxiety for up to 30 days. Max Daily Amount: 0.25 mg 30 tablet 0    glimepiride  (AMARYL ) 2 MG tablet Take 1 tablet by mouth 2 times daily 180 tablet 3    DULoxetine  (CYMBALTA ) 60 MG extended release capsule Take 1 capsule by mouth daily 90 capsule 3    furosemide  (LASIX ) 20 MG tablet Take 1 tablet by mouth every other day 45 tablet 3    metFORMIN  (GLUCOPHAGE ) 1000 MG tablet Take 1 tablet by mouth 2 times daily 180 tablet 3    Continuous Glucose Sensor (DEXCOM G7 SENSOR) MISC as directed change  snesor every 10 days for 30      vitamin B-12 (CYANOCOBALAMIN) 1000 MCG tablet Take 1 tablet by mouth daily      amLODIPine  (NORVASC ) 5 MG tablet Take 1 tablet by mouth daily 90 tablet 3    magnesium  Oxide 500 MG TABS Take 1.5 tablets by mouth daily      omeprazole (PRILOSEC) 20 MG delayed release capsule Take 1 capsule by mouth daily      Cholecalciferol 50 MCG (2000 UT) TABS Take 1 tablet by mouth daily       No current facility-administered medications for this visit.        Allergies   Allergen Reactions    Oxycodone Itching    Rocephin  [Ceftriaxone ] Rash     Developed a diffuse, widespread maculopapular rash 1 day after starting ceftriaxone  with minimal urticaria/edema.       Patient Active Problem List   Diagnosis     Hypomagnesemia    Type 2 diabetes mellitus without complication, without long-term current use of insulin  (HCC)    Intrahepatic cholangiocarcinoma (HCC)    Bladder cancer (HCC)    Hypokalemia    Mixed hyperlipidemia    Gastroesophageal reflux disease without esophagitis    Essential hypertension    Moderate episode of recurrent major depressive disorder (HCC)    Major depressive disorder, recurrent, moderate    Anxiety    Nonrheumatic pulmonary valve insufficiency    Mitral valve annular calcification    Vitamin B12 deficiency    Iron deficiency anemia       Past Medical History:   Diagnosis Date    Cancer (HCC)     Diabetes mellitus (HCC)     Hearing loss 2000    Have hearing aids and able to hear with them       Past Surgical History:   Procedure Laterality Date    CARPAL TUNNEL RELEASE Bilateral     CHOLECYSTECTOMY  04/2016    [facility]    COLONOSCOPY  04/2016    [facility]    COLONOSCOPY  12/2022    [facility]    CYSTOSCOPY  04/2022    Dr. Ardis Becton at [facility]    ESOPHAGOGASTRODUODENOSCOPY  12/2022    [facility]    EYE SURGERY      Cataracts removed    LIVER RESECTION  04/2016    [facility]    THORACENTESIS  04/2016    TRANSURETHRAL RESECTION OF BLADDER TUMOR  01/2016    [facility]    UPPER GASTROINTESTINAL ENDOSCOPY  12/2022    Normal        Social History     Socioeconomic History    Marital status: Married     Spouse name: None    Number of children: None    Years of education: None    Highest education level: None   Tobacco Use    Smoking status: Former     Current packs/day: 0.00     Average packs/day: 1 pack/day for 26.0 years (26.0 ttl pk-yrs)     Types: Cigarettes     Start date: 01/27/1967     Quit date: 01/26/1993     Years since quitting: 30.3    Smokeless tobacco: Never    Tobacco comments:     Quit in 1995   Vaping Use    Vaping status: Never Used   Substance and Sexual Activity    Alcohol use: Yes     Alcohol/week:  3.0 standard drinks of  alcohol     Types: 1 Glasses of wine, 2 Cans of beer per week    Drug use: Never    Sexual activity: Not Currently     Partners: Male     Social Drivers of Health     Financial Resource Strain: Low Risk  (12/25/2021)    Overall Financial Resource Strain (CARDIA)     Difficulty of Paying Living Expenses: Not hard at all   Food Insecurity: No Food Insecurity (03/02/2023)    Hunger Vital Sign     Worried About Running Out of Food in the Last Year: Never true     Ran Out of Food in the Last Year: Never true   Transportation Needs: No Transportation Needs (03/02/2023)    PRAPARE - Therapist, art (Medical): No     Lack of Transportation (Non-Medical): No   Physical Activity: Unknown (02/27/2023)    Exercise Vital Sign     Days of Exercise per Week: 0 days   Housing Stability: Low Risk  (03/02/2023)    Housing Stability Vital Sign     Unable to Pay for Housing in the Last Year: No     Number of Times Moved in the Last Year: 0     Homeless in the Last Year: No        Family History   Problem Relation Age of Onset    Stroke Mother 46    Heart Attack Mother     Vision Loss Mother     Melanoma Father     Cancer Father         Skin cancer and heart surgery hearing loss    Hearing Loss Father     Heart Disease Father     High Blood Pressure Father     Breast Cancer Sister     Breast Cancer Sister         Christiane Cowing had breast cancer 23 years ago and reacurance on other breast in December. Stage 1 were able to do lumpectomy.        PHYSICAL EXAM     BP 134/82 (BP Site: Left Upper Arm, Patient Position: Sitting, BP Cuff Size: Medium Adult)   Pulse 100   Temp 97.2 F (36.2 C) (Temporal)   Ht 1.486 m (4' 10.5")   Wt 64.9 kg (143 lb)   SpO2 95%   BMI 29.38 kg/m    Physical Exam  Vitals reviewed.   Constitutional:       General: She is not in acute distress.     Appearance: Normal appearance.   HENT:      Head: Normocephalic and atraumatic.      Mouth/Throat:      Mouth: Mucous membranes are moist.      Pharynx:  No oropharyngeal exudate or posterior oropharyngeal erythema.   Eyes:      Extraocular Movements: Extraocular movements intact.      Conjunctiva/sclera: Conjunctivae normal.      Pupils: Pupils are equal, round, and reactive to light.   Neck:      Vascular: No carotid bruit.   Cardiovascular:      Rate and Rhythm: Regular rhythm. Tachycardia present.      Pulses: Normal pulses.      Heart sounds: No murmur heard.     No friction rub. No gallop.   Pulmonary:      Effort: Pulmonary effort is normal. No respiratory distress.  Breath sounds: Normal breath sounds. No wheezing, rhonchi or rales.   Abdominal:      General: Bowel sounds are normal. There is no distension.      Palpations: Abdomen is soft. There is no mass.      Tenderness: There is no abdominal tenderness. There is no right CVA tenderness, left CVA tenderness or guarding.   Musculoskeletal:         General: No swelling or deformity. Normal range of motion.      Cervical back: Normal range of motion and neck supple. No rigidity.   Lymphadenopathy:      Cervical: No cervical adenopathy.   Skin:     General: Skin is warm.      Coloration: Skin is not jaundiced or pale.      Findings: No bruising or rash.   Neurological:      General: No focal deficit present.      Mental Status: She is alert and oriented to person, place, and time.      Cranial Nerves: No cranial nerve deficit.      Motor: No weakness.      Gait: Gait normal.      Deep Tendon Reflexes: Reflexes normal.   Psychiatric:         Attention and Perception: Attention normal.         Mood and Affect: Mood is anxious.         Speech: Speech normal.         Behavior: Behavior normal.         Thought Content: Thought content normal.         Cognition and Memory: Cognition normal.         The 10-year ASCVD risk score (Arnett DK, et al., 2019) is: 38.3%    Values used to calculate the score:      Age: 27 years      Sex: Female      Is Non-Hispanic African American: No      Diabetic: Yes      Tobacco  smoker: No      Systolic Blood Pressure: 134 mmHg      Is BP treated: Yes      HDL Cholesterol: 43 mg/dL      Total Cholesterol: 138 mg/dL     Results for orders placed or performed in visit on 05/27/23   POCT Urinalysis no Micro   Result Value Ref Range    Color, UA yellow     Clarity, UA clear     Glucose, UA POC neg mg/dL    Bilirubin, UA neg     Ketones, UA trace mg/dL    Spec Grav, UA 5.284     Blood, UA POC neg     pH, UA 6.0     Protein, UA POC 30 mg/dL    Urobilinogen, UA 0.2 mg/dL    Leukocytes, UA large     Nitrite, UA neg       ASSESSMENT/PLAN     1. Cystitis  -     ciprofloxacin  (CIPRO ) 500 MG tablet; Take 1 tablet by mouth 2 times daily for 7 days, Disp-14 tablet, R-0Normal  -     Culture, Urine; Future  -     POCT Urinalysis no Micro  Urinalysis in the office today was midstream and clean-catch and has a large amount of leukocytes.  Going to put her on Cipro  twice a day for 1 week while waiting for the culture  to return.  She did have a cystoscopy yesterday.  That report was reviewed.  Patient will call her neurologist and let him know that we are putting her on Cipro  today.  2. Anxiety  Patient took her Xanax  0.25 mg in the office today and she did feel less anxious after taking it.  She is also on duloxetine  60 mg daily  3. Type 2 diabetes mellitus without complication, without long-term current use of insulin  [facility])  Patient has a Dexcom so she can continuously monitor her blood sugar.  If she develops any hypoglycemia because of being on the Cipro  and the glimepiride  together then she can cut down on the glimepiride  while on the Cipro .  Still continue with metformin  1000 mg twice a day.  4. Essential hypertension  Blood pressure is stable and controlled with amlodipine  5 mg daily, Lasix  20 mg every other day alternating with spironolactone  50 mg every other day  Recent labs reviewed    Return for keep upcoming appointment.    COMMUNICATION:       Electronically signed by Venus Ginsberg, PA-C on  05/27/2023 at 3:15 PM

## 2023-05-28 ENCOUNTER — Inpatient Hospital Stay: Payer: MEDICARE | Primary: Internal Medicine

## 2023-05-29 LAB — CULTURE, URINE: Culture: NO GROWTH

## 2023-06-01 ENCOUNTER — Encounter: Payer: MEDICARE | Attending: Physician Assistant | Primary: Physician Assistant

## 2023-07-20 LAB — COMPREHENSIVE METABOLIC PANEL
ALT: 27 U/L
AST: 28 U/L
Albumin: 4.7 g/dL
Alkaline Phosphatase: 102 U/L
Anion Gap: 14 mmol/L
BUN: 23 mg/dL
CO2: 24 mmol/L
Calcium: 10.5 mg/dL
Chloride: 101 mmol/L
Creatinine: 0.93 mg/dL
Est, Glom Filt Rate: 64
Glucose: 171 mg/dL
Potassium: 4.4 mmol/L
Sodium: 139 mmol/L
Total Bilirubin: 0.4 mg/dL (ref 0.1–1.4)
Total Protein: 7.5 g/dL (ref 6.4–8.2)

## 2023-07-21 ENCOUNTER — Encounter

## 2023-07-21 NOTE — Telephone Encounter (Signed)
 Amy Arellano is calling to request a refill on the following medication(s):    Last Visit Date (If Applicable):  11/25/2022    Next Visit Date:    03/06/2024    Medication Request:  Requested Prescriptions     Pending Prescriptions Disp Refills    amLODIPine  (NORVASC ) 5 MG tablet 90 tablet 3     Sig: Take 1 tablet by mouth daily

## 2023-07-22 MED ORDER — AMLODIPINE BESYLATE 5 MG PO TABS
5 | ORAL_TABLET | Freq: Every day | ORAL | 3 refills | 90.00000 days | Status: AC
Start: 2023-07-22 — End: ?

## 2023-08-21 ENCOUNTER — Encounter

## 2023-08-23 NOTE — Telephone Encounter (Signed)
 Amy Arellano is calling to request a refill on the following medication(s):    Last Visit Date (If Applicable):  05/27/2023    Next Visit Date:    03/06/2024    Medication Request:  Requested Prescriptions     Pending Prescriptions Disp Refills    metFORMIN  (GLUCOPHAGE ) 1000 MG tablet [Pharmacy Med Name: metFORMIN  HCl Oral Tablet 1000 MG] 180 tablet 3     Sig: Take 1 tablet by mouth 2 times daily

## 2023-08-24 MED ORDER — METFORMIN HCL 1000 MG PO TABS
1000 | ORAL_TABLET | Freq: Two times a day (BID) | ORAL | 3 refills | 30.00000 days | Status: AC
Start: 2023-08-24 — End: ?

## 2023-10-18 ENCOUNTER — Ambulatory Visit
Admit: 2023-10-18 | Discharge: 2023-10-18 | Payer: MEDICARE | Attending: Physician Assistant | Primary: Physician Assistant

## 2023-10-18 MED ORDER — ALPRAZOLAM 0.25 MG PO TABS
0.25 | ORAL_TABLET | Freq: Every day | ORAL | 0 refills | 30.00000 days | Status: AC | PRN
Start: 2023-10-18 — End: 2024-01-26
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
## 2023-10-21 LAB — COMPREHENSIVE METABOLIC PANEL
ALT: 32 U/L (ref 10–35)
AST: 34 U/L (ref 10–35)
Albumin/Globulin Ratio: 1.8 (ref 1.0–2.5)
Albumin: 4.9 g/dL (ref 3.5–5.2)
Alkaline Phosphatase: 105 U/L — ABNORMAL HIGH (ref 35–104)
Anion Gap: 15 mmol/L (ref 9–16)
BUN: 33 mg/dL — ABNORMAL HIGH (ref 8–23)
CO2: 23 mmol/L (ref 20–31)
Calcium: 10.6 mg/dL — ABNORMAL HIGH (ref 8.6–10.4)
Chloride: 100 mmol/L (ref 98–107)
Creatinine: 1.1 mg/dL — ABNORMAL HIGH (ref 0.6–0.9)
Est, Glom Filt Rate: 52 mL/min/1.73m2 — ABNORMAL LOW (ref >60)
Glucose: 146 mg/dL — ABNORMAL HIGH (ref 74–99)
Potassium: 4.9 mmol/L (ref 3.7–5.3)
Sodium: 138 mmol/L (ref 136–145)
Total Bilirubin: 0.4 mg/dL (ref 0.0–1.2)
Total Protein: 7.7 g/dL (ref 6.6–8.7)

## 2023-10-21 LAB — HEMOGLOBIN A1C
Estimated Avg Glucose: 128 mg/dL
Hemoglobin A1C: 6.1 % — ABNORMAL HIGH (ref 4.0–6.0)

## 2023-10-21 LAB — VITAMIN B12: Vitamin B-12: 1118 pg/mL (ref 232–1245)

## 2023-12-22 ENCOUNTER — Encounter

## 2023-12-24 MED ORDER — DULOXETINE HCL 60 MG PO CPEP
60 | ORAL_CAPSULE | Freq: Every day | ORAL | 1 refills | 30.00000 days | Status: AC
Start: 2023-12-24 — End: ?

## 2023-12-24 NOTE — Telephone Encounter (Signed)
"    Amy Arellano is calling to request a refill on the following medication(s):    Last Visit Date (If Applicable):  10/18/2023    Next Visit Date:    01/26/2024    Medication Request:  Requested Prescriptions     Pending Prescriptions Disp Refills    DULoxetine  (CYMBALTA ) 60 MG extended release capsule [Pharmacy Med Name: DULoxetine  HCl Oral Capsule Delayed Release Particles 60 MG] 90 capsule 0     Sig: Take 1 capsule by mouth daily               "

## 2024-01-09 ENCOUNTER — Encounter

## 2024-01-10 NOTE — Telephone Encounter (Signed)
"    Amy Arellano is calling to request a refill on the following medication(s):    Last Visit Date (If Applicable):  10/18/2023    Next Visit Date:    01/26/2024    Medication Request:  Requested Prescriptions     Pending Prescriptions Disp Refills    glimepiride  (AMARYL ) 2 MG tablet [Pharmacy Med Name: Glimepiride  Oral Tablet 2 MG] 180 tablet 3     Sig: Take 1 tablet by mouth 2 times daily               "

## 2024-01-11 MED ORDER — GLIMEPIRIDE 2 MG PO TABS
2 | ORAL_TABLET | Freq: Two times a day (BID) | ORAL | 3 refills | Status: AC
Start: 2024-01-11 — End: ?

## 2024-01-14 NOTE — Telephone Encounter (Signed)
"  Patient would like to know if she should keep her appointment on 01/26/24 with Leita Molly. She has an MRI scheduled on January 9th and is seeing her Oncologist on January 15th at the [facility]. Please Advise.  "

## 2024-01-20 ENCOUNTER — Encounter

## 2024-01-21 MED ORDER — FUROSEMIDE 20 MG PO TABS
20 | ORAL_TABLET | ORAL | 3 refills | 30.00000 days | Status: AC
Start: 2024-01-21 — End: ?

## 2024-01-21 NOTE — Telephone Encounter (Signed)
"    Amy Arellano is calling to request a refill on the following medication(s):    Last Visit Date (If Applicable):  10/18/2023    Next Visit Date:    01/26/2024    Medication Request:  Requested Prescriptions     Pending Prescriptions Disp Refills    furosemide  (LASIX ) 20 MG tablet [Pharmacy Med Name: Furosemide  Oral Tablet 20 MG] 45 tablet 3     Sig: Take 1 tablet by mouth every other day               "

## 2024-01-24 LAB — COMPREHENSIVE METABOLIC PANEL
ALT: 28 U/L (ref 10–35)
AST: 30 U/L (ref 10–35)
Albumin/Globulin Ratio: 1.5 (ref 1.0–2.5)
Albumin: 4.4 g/dL (ref 3.5–5.2)
Alkaline Phosphatase: 129 U/L — ABNORMAL HIGH (ref 35–104)
Anion Gap: 10 mmol/L (ref 9–16)
BUN: 20 mg/dL (ref 8–23)
CO2: 31 mmol/L (ref 20–31)
Calcium: 10.2 mg/dL (ref 8.6–10.4)
Chloride: 101 mmol/L (ref 98–107)
Creatinine: 1 mg/dL — ABNORMAL HIGH (ref 0.6–0.9)
Est, Glom Filt Rate: 58 mL/min/1.73m2 — ABNORMAL LOW (ref >60)
Glucose: 153 mg/dL — ABNORMAL HIGH (ref 74–99)
Potassium: 5 mmol/L (ref 3.7–5.3)
Sodium: 142 mmol/L (ref 136–145)
Total Bilirubin: 0.4 mg/dL (ref 0.0–1.2)
Total Protein: 7.3 g/dL (ref 6.6–8.7)

## 2024-01-24 LAB — HEMOGLOBIN A1C
Estimated Avg Glucose: 146 mg/dL
Hemoglobin A1C: 6.7 % — ABNORMAL HIGH (ref 4.0–6.0)

## 2024-01-24 LAB — VITAMIN B12: Vitamin B-12: 615 pg/mL (ref 232–1245)

## 2024-01-24 NOTE — Telephone Encounter (Signed)
"  I left a message to call the office back.  "

## 2024-01-24 NOTE — Telephone Encounter (Signed)
"  Patient called back and informed on Laura's response.  "

## 2024-01-26 ENCOUNTER — Ambulatory Visit
Admit: 2024-01-26 | Discharge: 2024-01-26 | Payer: MEDICARE | Attending: Physician Assistant | Primary: Physician Assistant

## 2024-01-26 VITALS — BP 122/60 | HR 89 | Temp 91.70000°F | Ht <= 58 in | Wt 138.0 lb

## 2024-01-26 DIAGNOSIS — E119 Type 2 diabetes mellitus without complications: Principal | ICD-10-CM

## 2024-01-26 NOTE — Progress Notes (Signed)
 "  [facility]     Date of Visit:  01/26/2024  Patient Name: Amy Arellano   Patient DOB:  08-19-1945     CHIEF COMPLAINT:     Amy Arellano is a 78 y.o. female who presents today for an general visit to be evaluated for the following condition(s):  Chief Complaint   Patient presents with    Follow-up     sugar       HISTORY OF PRESENT ILLNESS      Had 5 rounds of radiation to liver through [facility] end of November  Has appt with oncologist in Jan 15, has MRI Jan 9    Patient is doing well with her diabetes.  No hypoglycemia.  She did see an eye doctor recently and no diabetic retinopathy.  No complaints of neuropathy.  No hypoglycemia.  Dexcom works very well for her.    Depression is stable and controlled on her medication.  No anxiety.  Sleeping well.  Rarely has to use the Xanax .    No chest pains.  No shortness of breath.  No palpitations.  No GI complaints.  Bowel movements okay.  Urinating okay.  No blood or black stools or urine.  No acid reflux complaints    REVIEW OF SYSTEMS     Review of Systems   Constitutional:  Negative for chills, fever and unexpected weight change.   HENT:  Negative for congestion, ear pain, hearing loss, rhinorrhea, sinus pressure, sinus pain, sneezing and sore throat.    Eyes:  Negative for pain, redness and visual disturbance.   Respiratory:  Negative for cough, shortness of breath and wheezing.    Cardiovascular:  Negative for chest pain, palpitations and leg swelling.   Gastrointestinal:  Negative for abdominal pain, blood in stool, constipation, diarrhea, nausea and vomiting.   Endocrine: Negative for cold intolerance and heat intolerance.   Genitourinary:  Negative for difficulty urinating, dysuria, frequency, hematuria and urgency.   Musculoskeletal:  Negative for arthralgias, back pain, gait problem, joint swelling, myalgias and neck pain.   Skin:  Negative for rash and wound.   Allergic/Immunologic: Negative for immunocompromised state.   Neurological:   Negative for dizziness, tremors, seizures, syncope, speech difficulty, weakness, light-headedness, numbness and headaches.   Psychiatric/Behavioral:  Negative for confusion, hallucinations and sleep disturbance. The patient is not nervous/anxious.         REVIEWED INFORMATION      Current Outpatient Medications   Medication Sig Dispense Refill    furosemide  (LASIX ) 20 MG tablet Take 1 tablet by mouth every other day 45 tablet 3    glimepiride  (AMARYL ) 2 MG tablet Take 1 tablet by mouth 2 times daily 180 tablet 3    DULoxetine  (CYMBALTA ) 60 MG extended release capsule Take 1 capsule by mouth daily 90 capsule 1    ALPRAZolam  (XANAX ) 0.25 MG tablet Take 1 tablet by mouth daily as needed for Anxiety for up to 30 days. Max Daily Amount: 0.25 mg 30 tablet 0    metFORMIN  (GLUCOPHAGE ) 1000 MG tablet Take 1 tablet by mouth 2 times daily 180 tablet 3    amLODIPine  (NORVASC ) 5 MG tablet Take 1 tablet by mouth daily 90 tablet 3    spironolactone  (ALDACTONE ) 50 MG tablet Take 1 tablet by mouth every other day 45 tablet 3    Continuous Glucose Sensor (DEXCOM G7 SENSOR) MISC as directed change snesor every 10 days for 30      vitamin B-12 (CYANOCOBALAMIN)  1000 MCG tablet Take 1 tablet by mouth daily      magnesium  Oxide 500 MG TABS Take 1.5 tablets by mouth daily      omeprazole (PRILOSEC) 20 MG delayed release capsule Take 1 capsule by mouth daily      Cholecalciferol 50 MCG (2000 UT) TABS Take 1 tablet by mouth daily       No current facility-administered medications for this visit.        Allergies   Allergen Reactions    Oxycodone Itching    Rocephin  [Ceftriaxone ] Rash     Developed a diffuse, widespread maculopapular rash 1 day after starting ceftriaxone  with minimal urticaria/edema.       Patient Active Problem List   Diagnosis    Hypomagnesemia    Type 2 diabetes mellitus without complication, without long-term current use of insulin  (HCC)    Intrahepatic cholangiocarcinoma (HCC)    Bladder cancer (HCC)    Mixed  hyperlipidemia    Gastroesophageal reflux disease without esophagitis    Essential hypertension    Major depressive disorder, recurrent, moderate    Anxiety    Nonrheumatic pulmonary valve insufficiency    Mitral valve annular calcification    Vitamin B12 deficiency    Hepatocellular carcinoma (HCC)       Past Medical History:   Diagnosis Date    Cancer (HCC)     Diabetes mellitus (HCC)     Hearing loss 2000    Have hearing aids and able to hear with them       Past Surgical History:   Procedure Laterality Date    CARPAL TUNNEL RELEASE Bilateral     CHOLECYSTECTOMY  04/2016    [facility]    COLONOSCOPY  04/2016    [place] [facility]    COLONOSCOPY  12/2022    [facility]    CYSTOSCOPY  04/2022    Dr[facility] Mar[facility] at [facility]    ESOPHAGOGASTRODUODENOSCOPY  12/2022    [place] [facility]    EYE SURGERY      Cataracts removed    LIVER RESECTION  04/2016    [facility]    THORACENTESIS  04/2016    TRANSURETHRAL RESECTION OF BLADDER TUMOR  01/2016    [facility]    UPPER GASTROINTESTINAL ENDOSCOPY  12/2022    Normal        Social History     Socioeconomic History    Marital status: Married     Spouse name: None    Number of children: None    Years of education: None    Highest education level: None   Tobacco Use    Smoking status: Former     Current packs/day: 0.00     Average packs/day: 1 pack/day for 26.0 years (26.0 ttl pk-yrs)     Types: Cigarettes     Start date: 01/27/1967     Quit date: 01/26/1993     Years since quitting: 31.0    Smokeless tobacco: Never    Tobacco comments:     Quit in 1995   Vaping Use    Vaping status: Never Used   Substance and Sexual Activity    Alcohol use: Yes     Alcohol/week: 3.0 standard drinks of alcohol     Types: 1 Glasses of wine, 2 Cans of beer per week    Drug use: Never    Sexual activity: Not Currently     Partners: Male     Social Drivers of  Health     Financial Resource Strain: Low Risk  (12/25/2021)    Overall Financial Resource Strain (CARDIA)      Difficulty of Paying Living Expenses: Not hard at all   Food Insecurity: No Food Insecurity (03/02/2023)    Hunger Vital Sign     Worried About Running Out of Food in the Last Year: Never true     Ran Out of Food in the Last Year: Never true   Transportation Needs: No Transportation Needs (03/02/2023)    PRAPARE - Therapist, Art (Medical): No     Lack of Transportation (Non-Medical): No   Physical Activity: Unknown (02/27/2023)    Exercise Vital Sign     Days of Exercise per Week: 0 days   Housing Stability: Low Risk  (03/02/2023)    Housing Stability Vital Sign     Unable to Pay for Housing in the Last Year: No     Number of Times Moved in the Last Year: 0     Homeless in the Last Year: No        Family History   Problem Relation Age of Onset    Stroke Mother 19    Heart Attack Mother     Vision Loss Mother     Melanoma Father     Cancer Father         Skin cancer and heart surgery hearing loss    Hearing Loss Father     Heart Disease Father     High Blood Pressure Father     Breast Cancer Sister     Breast Cancer Sister         Mitzie had breast cancer 23 years ago and reacurance on other breast in December. Stage 1 were able to do lumpectomy.        PHYSICAL EXAM     BP 122/60 (BP Site: Left Upper Arm, Patient Position: Sitting, BP Cuff Size: Large Adult)   Pulse 89   Temp (!) 91.7 F (33.2 C) (Temporal)   Ht 1.473 m (4' 10)   Wt 62.6 kg (138 lb)   SpO2 98%   BMI 28.84 kg/m    Physical Exam  Vitals reviewed.   Constitutional:       General: She is not in acute distress.     Appearance: Normal appearance.   HENT:      Head: Normocephalic and atraumatic.      Right Ear: Tympanic membrane and external ear normal.      Left Ear: Tympanic membrane and external ear normal.      Nose: Nose normal.      Mouth/Throat:      Mouth: Mucous membranes are moist.      Pharynx: No oropharyngeal exudate or posterior oropharyngeal erythema.   Eyes:      Extraocular Movements: Extraocular movements  intact.      Conjunctiva/sclera: Conjunctivae normal.      Pupils: Pupils are equal, round, and reactive to light.   Neck:      Vascular: No carotid bruit.   Cardiovascular:      Rate and Rhythm: Normal rate and regular rhythm.      Pulses: Normal pulses.      Heart sounds: No murmur heard.     No friction rub. No gallop.   Pulmonary:      Effort: Pulmonary effort is normal. No respiratory distress.      Breath sounds: Normal breath sounds. No wheezing,  rhonchi or rales.   Abdominal:      General: Bowel sounds are normal. There is no distension.      Palpations: Abdomen is soft. There is no mass.      Tenderness: There is no abdominal tenderness. There is no guarding.   Musculoskeletal:         General: No swelling or deformity. Normal range of motion.      Cervical back: Normal range of motion and neck supple. No rigidity.   Lymphadenopathy:      Cervical: No cervical adenopathy.   Skin:     General: Skin is warm.      Coloration: Skin is not jaundiced or pale.      Findings: No bruising or rash.   Neurological:      General: No focal deficit present.      Mental Status: She is alert and oriented to person, place, and time.      Cranial Nerves: No cranial nerve deficit.      Motor: No weakness.      Gait: Gait normal.      Deep Tendon Reflexes: Reflexes normal.   Psychiatric:         Mood and Affect: Mood normal.         Thought Content: Thought content normal.         The 10-year ASCVD risk score (Arnett DK, et al., 2019) is: 36.2%    Values used to calculate the score:      Age: 26 years      Clinically relevant sex: Female      Is Non-Hispanic African American: No      Diabetic: Yes      Tobacco smoker: No      Systolic Blood Pressure: 122 mmHg      Is BP treated: Yes      HDL Cholesterol: 43 mg/dL      Total Cholesterol: 138 mg/dL     Results for orders placed or performed during the hospital encounter of 01/24/24   Vitamin B12   Result Value Ref Range    Vitamin B-12 615 232 - 1245 pg/mL   Comprehensive  Metabolic Panel   Result Value Ref Range    Sodium 142 136 - 145 mmol/L    Potassium 5.0 3.7 - 5.3 mmol/L    Chloride 101 98 - 107 mmol/L    CO2 31 20 - 31 mmol/L    Anion Gap 10 9 - 16 mmol/L    Glucose 153 (H) 74 - 99 mg/dL    BUN 20 8 - 23 mg/dL    Creatinine 1.0 (H) 0.6 - 0.9 mg/dL    Est, Glom Filt Rate 58 (L) >60 mL/min/1.37m2    Calcium 10.2 8.6 - 10.4 mg/dL    Total Protein 7.3 6.6 - 8.7 g/dL    Albumin 4.4 3.5 - 5.2 g/dL    Albumin/Globulin Ratio 1.5 1.0 - 2.5    Total Bilirubin 0.4 0.0 - 1.2 mg/dL    Alkaline Phosphatase 129 (H) 35 - 104 U/L    ALT 28 10 - 35 U/L    AST 30 10 - 35 U/L   Hemoglobin A1C   Result Value Ref Range    Hemoglobin A1C 6.7 (H) 4.0 - 6.0 %    Estimated Avg Glucose 146 mg/dL      ASSESSMENT/PLAN     1. Type 2 diabetes mellitus without complication, without long-term current use of insulin  (HCC)  Diabetes is controlled  with an A1c of 6.7.  Continue metformin  1000 mg twice a day and Amaryl  2 mg twice a day.  Continue monitoring sugars with her Dexcom.  She is up-to-date on eye exam and no retinopathy.  Continue diabetic diet and exercise.  2. Essential hypertension  Blood pressure stable and well-controlled with amlodipine  5 mg daily, alternating between Lasix  20 mg 1 day and spironolactone  50 mg the other day.  3. Encounter for screening mammogram for malignant neoplasm of breast  -     [facility] DIGITAL SCREEN W OR WO CAD BILATERAL; Future  4. Vitamin B12 deficiency  Continue over-the-counter B12 supplement  5. Moderate episode of recurrent major depressive disorder (HCC)  Symptoms are stable and controlled with duloxetine  60 mg daily.  Rarely uses Xanax .  [facility] report done.  No red flags.  6. Malignant neoplasm of urinary bladder, unspecified site [facility])  Patient has appointment with oncologist in a couple of weeks.  7. Hepatocellular carcinoma (HCC)  Patient recently completed radiation treatment to the liver at the end of November early December.  She has an MRI of the liver scheduled in  about 10 days with a follow-up appointment with oncology after that.  8. Gastroesophageal reflux disease without esophagitis  Symptoms are stable and controlled with omeprazole 20 mg daily.  9. Mixed hyperlipidemia  Continue low-cholesterol diet and exercise.  Try to avoid statins that would go through the liver given her history of liver cancer.    Return in about 3 months (around 04/25/2024) for 3-4 months f/u DM.    COMMUNICATION:       Electronically signed by Leita LITTIE Molly, PA-C on 01/26/2024 at 11:24 AM  "
